# Patient Record
Sex: Female | Born: 1950
Health system: Southern US, Community
[De-identification: ages and names within clinical notes are randomized; demographics above are authoritative.]

## PROBLEM LIST (undated history)

## (undated) DIAGNOSIS — G43909 Migraine, unspecified, not intractable, without status migrainosus: Secondary | ICD-10-CM

## (undated) DIAGNOSIS — M1990 Unspecified osteoarthritis, unspecified site: Secondary | ICD-10-CM

## (undated) DIAGNOSIS — K589 Irritable bowel syndrome without diarrhea: Secondary | ICD-10-CM

## (undated) DIAGNOSIS — R112 Nausea with vomiting, unspecified: Secondary | ICD-10-CM

## (undated) DIAGNOSIS — F419 Anxiety disorder, unspecified: Secondary | ICD-10-CM

## (undated) DIAGNOSIS — R634 Abnormal weight loss: Secondary | ICD-10-CM

## (undated) DIAGNOSIS — S0531XA Ocular laceration without prolapse or loss of intraocular tissue, right eye, initial encounter: Secondary | ICD-10-CM

## (undated) DIAGNOSIS — J45909 Unspecified asthma, uncomplicated: Secondary | ICD-10-CM

## (undated) DIAGNOSIS — K219 Gastro-esophageal reflux disease without esophagitis: Secondary | ICD-10-CM

## (undated) DIAGNOSIS — R51 Headache: Secondary | ICD-10-CM

## (undated) HISTORY — DX: Irritable bowel syndrome, unspecified: K58.9

## (undated) HISTORY — PX: APPENDECTOMY: SHX54

## (undated) HISTORY — DX: Ocular laceration without prolapse or loss of intraocular tissue, right eye, initial encounter: S05.31XA

## (undated) HISTORY — DX: Migraine, unspecified, not intractable, without status migrainosus: G43.909

---

## 1981-11-16 HISTORY — PX: OVARIAN CYST REMOVAL: SHX89

## 1998-07-09 ENCOUNTER — Other Ambulatory Visit: Admission: RE | Admit: 1998-07-09 | Discharge: 1998-07-09 | Payer: Self-pay | Admitting: Obstetrics and Gynecology

## 1998-07-11 ENCOUNTER — Ambulatory Visit (HOSPITAL_COMMUNITY): Admission: RE | Admit: 1998-07-11 | Discharge: 1998-07-11 | Payer: Self-pay | Admitting: Obstetrics and Gynecology

## 1998-07-23 ENCOUNTER — Inpatient Hospital Stay (HOSPITAL_COMMUNITY): Admission: RE | Admit: 1998-07-23 | Discharge: 1998-07-25 | Payer: Self-pay | Admitting: Obstetrics and Gynecology

## 1998-11-16 HISTORY — PX: ABDOMINAL HYSTERECTOMY: SHX81

## 1999-04-28 ENCOUNTER — Emergency Department (HOSPITAL_COMMUNITY): Admission: EM | Admit: 1999-04-28 | Discharge: 1999-04-28 | Payer: Self-pay | Admitting: Emergency Medicine

## 1999-04-28 ENCOUNTER — Encounter: Payer: Self-pay | Admitting: Emergency Medicine

## 1999-10-15 ENCOUNTER — Other Ambulatory Visit: Admission: RE | Admit: 1999-10-15 | Discharge: 1999-10-15 | Payer: Self-pay | Admitting: Obstetrics and Gynecology

## 2000-04-19 ENCOUNTER — Encounter (INDEPENDENT_AMBULATORY_CARE_PROVIDER_SITE_OTHER): Payer: Self-pay | Admitting: *Deleted

## 2000-04-19 ENCOUNTER — Ambulatory Visit (HOSPITAL_COMMUNITY): Admission: RE | Admit: 2000-04-19 | Discharge: 2000-04-19 | Payer: Self-pay | Admitting: Gastroenterology

## 2000-07-08 ENCOUNTER — Emergency Department (HOSPITAL_COMMUNITY): Admission: EM | Admit: 2000-07-08 | Discharge: 2000-07-08 | Payer: Self-pay | Admitting: Emergency Medicine

## 2000-07-08 ENCOUNTER — Encounter: Payer: Self-pay | Admitting: Emergency Medicine

## 2000-10-29 ENCOUNTER — Other Ambulatory Visit: Admission: RE | Admit: 2000-10-29 | Discharge: 2000-10-29 | Payer: Self-pay | Admitting: Obstetrics and Gynecology

## 2001-12-06 ENCOUNTER — Other Ambulatory Visit: Admission: RE | Admit: 2001-12-06 | Discharge: 2001-12-06 | Payer: Self-pay | Admitting: Obstetrics and Gynecology

## 2003-11-30 ENCOUNTER — Ambulatory Visit (HOSPITAL_COMMUNITY): Admission: RE | Admit: 2003-11-30 | Discharge: 2003-11-30 | Payer: Self-pay | Admitting: Gastroenterology

## 2004-09-06 ENCOUNTER — Emergency Department (HOSPITAL_COMMUNITY): Admission: EM | Admit: 2004-09-06 | Discharge: 2004-09-06 | Payer: Self-pay | Admitting: Emergency Medicine

## 2005-03-04 ENCOUNTER — Emergency Department (HOSPITAL_COMMUNITY): Admission: EM | Admit: 2005-03-04 | Discharge: 2005-03-04 | Payer: Self-pay | Admitting: Emergency Medicine

## 2006-08-30 ENCOUNTER — Encounter: Admission: RE | Admit: 2006-08-30 | Discharge: 2006-08-30 | Payer: Self-pay | Admitting: Gastroenterology

## 2006-09-02 ENCOUNTER — Ambulatory Visit (HOSPITAL_COMMUNITY): Admission: RE | Admit: 2006-09-02 | Discharge: 2006-09-02 | Payer: Self-pay | Admitting: Gastroenterology

## 2006-12-09 ENCOUNTER — Encounter: Admission: RE | Admit: 2006-12-09 | Discharge: 2006-12-09 | Payer: Self-pay | Admitting: Family Medicine

## 2006-12-23 ENCOUNTER — Encounter: Admission: RE | Admit: 2006-12-23 | Discharge: 2006-12-23 | Payer: Self-pay | Admitting: Family Medicine

## 2007-02-04 ENCOUNTER — Encounter: Admission: RE | Admit: 2007-02-04 | Discharge: 2007-02-04 | Payer: Self-pay | Admitting: Family Medicine

## 2010-07-12 ENCOUNTER — Emergency Department (HOSPITAL_COMMUNITY): Admission: EM | Admit: 2010-07-12 | Discharge: 2010-07-12 | Payer: Self-pay | Admitting: Emergency Medicine

## 2010-07-14 ENCOUNTER — Ambulatory Visit (HOSPITAL_COMMUNITY)
Admission: RE | Admit: 2010-07-14 | Discharge: 2010-07-14 | Payer: Self-pay | Source: Home / Self Care | Admitting: Emergency Medicine

## 2010-11-28 ENCOUNTER — Ambulatory Visit (HOSPITAL_BASED_OUTPATIENT_CLINIC_OR_DEPARTMENT_OTHER)
Admission: RE | Admit: 2010-11-28 | Discharge: 2010-11-28 | Payer: Self-pay | Source: Home / Self Care | Attending: Family Medicine | Admitting: Family Medicine

## 2010-12-07 ENCOUNTER — Encounter: Payer: Self-pay | Admitting: Family Medicine

## 2010-12-17 ENCOUNTER — Encounter: Payer: Self-pay | Admitting: Family Medicine

## 2011-04-03 NOTE — Procedures (Signed)
[place]. [facility]  Patient:    Emily White, Emily White                     MRN: [id number] Proc. Date: 04/19/00 Attending:  Florencia Reasons, M.D. CC:         Harl Bowie, M.D.                           Procedure Report  PROCEDURE PERFORMED:  Upper endoscopy with biopsies.  ENDOSCOPIST:  Florencia Reasons, M.D.  INDICATIONS FOR PROCEDURE:  The patient is a 60 year old female with a 10-pound involuntary weight loss over the past year or so, as well as nonspecific abdominal discomfort and generalized fatigue.  FINDINGS:  Essentially normal exam.  DESCRIPTION OF PROCEDURE:  The nature, purpose and risks of the procedure had been discussed with the patient, who provided written consent.  Sedation was fentanyl 100 mcg and Versed 8 mg IV without arrhythmias or desaturation. The Olympus adult video endoscope was passed under direct vision.  The vocal cords were very briefly seen and appeared grossly normal.  The esophagus was entered without undue difficulty and had normal mucosa without evidence of Barretts esophagitis, varices, infection or neoplasia.  There was a small hiatal hernia present but this was best seen on retroflex viewing which showed an otherwise normal cardia of the stomach.  Upon entering the stomach with the endoscope, there was no significant residual, just a little bit of clear fluid, no blood, coffee ground material or bile.  The gastric mucosa was normal, without evidence of gastritis, erosions, ulcers, polyps or masses. The pylorus and duodenal bulb were unremarkable.  The second duodenum may have had some slight villous flattening but this is a very soft call; multiple biopsies were obtained prior to removal of the scope.  The patient tolerated the procedure well and there were no apparent complications.  IMPRESSION:  Essentially normal upper endoscopy, without source of weight loss endoscopically evident.  PLAN:  Await pathology  on duodenal biopsies.  Continue Protonix 40 mg daily since this does seem to have offered her some improvement. DD:  04/19/00 TD:  04/22/00 Job: [id number]

## 2011-04-03 NOTE — Op Note (Signed)
Emily White, Emily White                         ACCOUNT NO.:  [id number]   MEDICAL RECORD NO.:  [id number]                   PATIENT TYPE:  AMB   LOCATION:  ENDO                                 FACILITY:  [facility]   PHYSICIAN:  Bernette Redbird, M.D.                DATE OF BIRTH:  14-May-1951   DATE OF PROCEDURE:  11/30/2003  DATE OF DISCHARGE:                                 OPERATIVE REPORT   PROCEDURE:  Colonoscopy.   INDICATIONS FOR PROCEDURE:  Family history of colon cancer.   ENDOSCOPIST:  Bernette Redbird, M.D.   FINDINGS:  Fixation of the sigmoid colon probably due to pelvic adhesions.   DESCRIPTION OF PROCEDURE:  The nature, purpose and risks of the procedure  had been discussed with the patient who provided written consent.  Sedation  was Phenergan 12.5 mg IV (because of a history of nausea), Fentanyl 100 mcg  IV and Versed 10 mg IV prior to and during the course of the procedure.   The Olympus adjustable tension pediatric video colonoscope was advanced  through the cecum as identified by clear visualization of the ileocecal  valve and transillumination to the right lower quadrant and pullback was  then performed.  The quality of the prep was excellent and it was felt that  all areas were well seen.   This was a technically challenging exam due to severe angulation and  fixation in the sigmoid region.  It probably took about five or ten minutes  just to negotiate my way through that very limited section of the colon.  This was accomplished with the patient in the supine position and also with  the help of some external abdominal pressure.  Once past that area, however,  the scope was quite easily to advance the remainder of the way around the  colon, especially after taking out a loop.   Apart from the above mentioned angulation and fixation, this was a normal  examination.  No polyps, cancer, colitis, vascular malformations or  diverticulosis were noted.  Due to a small  rectal ampulla, I did not attempt  retroflexion in the rectum, but careful antegrade viewing as well as  reinspection of the rectum disclosed no abnormalities.   No biopsies were obtained.  The patient tolerated the procedure well and  there were no apparent complications.   IMPRESSION:  1. Family history of colon cancer (V16.0).  2. History of intermittent abdominal pain including the right lower     quadrant.  No source identified on today's exam (789.03).  3. No polyps or pre-neoplastic lesions identified today.   PLAN:  1. Repeat evaluation of the colon in five years.  Due to the technical     difficulties of examining this patient's colon, consideration could be     given to a virtual colonoscopy at that time.  Bernette Redbird, M.D.    RB/MEDQ  D:  11/30/2003  T:  11/30/2003  Job:  [id number]   cc:   Donia Guiles, M.D.  [address] [place]  [place] [address]  Fax: [phone]

## 2011-07-29 ENCOUNTER — Inpatient Hospital Stay (INDEPENDENT_AMBULATORY_CARE_PROVIDER_SITE_OTHER)
Admission: RE | Admit: 2011-07-29 | Discharge: 2011-07-29 | Disposition: A | Discharge status: Home / Self Care | Payer: PRIVATE HEALTH INSURANCE | Source: Ambulatory Visit | Attending: Family Medicine | Admitting: Family Medicine

## 2011-07-29 ENCOUNTER — Ambulatory Visit (INDEPENDENT_AMBULATORY_CARE_PROVIDER_SITE_OTHER): Payer: PRIVATE HEALTH INSURANCE

## 2011-07-29 ENCOUNTER — Other Ambulatory Visit (HOSPITAL_COMMUNITY): Payer: Self-pay | Admitting: Family Medicine

## 2011-07-29 DIAGNOSIS — W19XXXA Unspecified fall, initial encounter: Secondary | ICD-10-CM

## 2011-07-29 DIAGNOSIS — S93409A Sprain of unspecified ligament of unspecified ankle, initial encounter: Secondary | ICD-10-CM

## 2011-08-31 ENCOUNTER — Other Ambulatory Visit (HOSPITAL_BASED_OUTPATIENT_CLINIC_OR_DEPARTMENT_OTHER): Payer: Self-pay | Admitting: Orthopedic Surgery

## 2011-08-31 DIAGNOSIS — R52 Pain, unspecified: Secondary | ICD-10-CM

## 2011-09-01 ENCOUNTER — Ambulatory Visit (HOSPITAL_BASED_OUTPATIENT_CLINIC_OR_DEPARTMENT_OTHER)
Admission: RE | Admit: 2011-09-01 | Discharge: 2011-09-01 | Disposition: A | Discharge status: Home / Self Care | Payer: PRIVATE HEALTH INSURANCE | Source: Ambulatory Visit | Attending: Orthopedic Surgery | Admitting: Orthopedic Surgery

## 2011-09-01 DIAGNOSIS — R609 Edema, unspecified: Secondary | ICD-10-CM

## 2011-09-01 DIAGNOSIS — M25476 Effusion, unspecified foot: Secondary | ICD-10-CM | POA: Insufficient documentation

## 2011-09-01 DIAGNOSIS — R52 Pain, unspecified: Secondary | ICD-10-CM

## 2011-09-01 DIAGNOSIS — M25579 Pain in unspecified ankle and joints of unspecified foot: Secondary | ICD-10-CM | POA: Insufficient documentation

## 2011-09-01 DIAGNOSIS — M659 Synovitis and tenosynovitis, unspecified: Secondary | ICD-10-CM

## 2011-09-01 DIAGNOSIS — M67919 Unspecified disorder of synovium and tendon, unspecified shoulder: Secondary | ICD-10-CM | POA: Insufficient documentation

## 2011-09-01 DIAGNOSIS — M25473 Effusion, unspecified ankle: Secondary | ICD-10-CM | POA: Insufficient documentation

## 2011-09-01 DIAGNOSIS — M719 Bursopathy, unspecified: Secondary | ICD-10-CM | POA: Insufficient documentation

## 2012-08-16 ENCOUNTER — Other Ambulatory Visit: Payer: Self-pay | Admitting: Obstetrics and Gynecology

## 2012-08-16 DIAGNOSIS — N6311 Unspecified lump in the right breast, upper outer quadrant: Secondary | ICD-10-CM

## 2012-08-18 ENCOUNTER — Ambulatory Visit
Admission: RE | Admit: 2012-08-18 | Discharge: 2012-08-18 | Disposition: A | Discharge status: Home / Self Care | Payer: PRIVATE HEALTH INSURANCE | Source: Ambulatory Visit | Attending: Obstetrics and Gynecology | Admitting: Obstetrics and Gynecology

## 2012-08-18 DIAGNOSIS — N6311 Unspecified lump in the right breast, upper outer quadrant: Secondary | ICD-10-CM

## 2012-09-06 ENCOUNTER — Other Ambulatory Visit: Payer: Self-pay | Admitting: Obstetrics and Gynecology

## 2013-07-18 ENCOUNTER — Other Ambulatory Visit: Payer: Self-pay

## 2013-07-18 DIAGNOSIS — Z1231 Encounter for screening mammogram for malignant neoplasm of breast: Secondary | ICD-10-CM

## 2013-08-21 ENCOUNTER — Ambulatory Visit
Admission: RE | Admit: 2013-08-21 | Discharge: 2013-08-21 | Disposition: A | Discharge status: Home / Self Care | Payer: PRIVATE HEALTH INSURANCE | Source: Ambulatory Visit

## 2013-08-21 DIAGNOSIS — Z1231 Encounter for screening mammogram for malignant neoplasm of breast: Secondary | ICD-10-CM

## 2014-01-04 ENCOUNTER — Other Ambulatory Visit: Payer: Self-pay | Admitting: Physician Assistant

## 2014-01-04 ENCOUNTER — Ambulatory Visit
Admission: RE | Admit: 2014-01-04 | Discharge: 2014-01-04 | Disposition: A | Discharge status: Home / Self Care | Payer: PRIVATE HEALTH INSURANCE | Source: Ambulatory Visit | Attending: Physician Assistant | Admitting: Physician Assistant

## 2014-01-04 DIAGNOSIS — R079 Chest pain, unspecified: Secondary | ICD-10-CM

## 2014-03-21 ENCOUNTER — Other Ambulatory Visit: Payer: Self-pay | Admitting: Physician Assistant

## 2014-03-21 ENCOUNTER — Ambulatory Visit
Admission: RE | Admit: 2014-03-21 | Discharge: 2014-03-21 | Disposition: A | Discharge status: Home / Self Care | Payer: PRIVATE HEALTH INSURANCE | Source: Ambulatory Visit | Attending: Physician Assistant | Admitting: Physician Assistant

## 2014-03-21 ENCOUNTER — Ambulatory Visit: Payer: PRIVATE HEALTH INSURANCE

## 2014-03-21 DIAGNOSIS — M799 Soft tissue disorder, unspecified: Secondary | ICD-10-CM

## 2014-03-22 ENCOUNTER — Other Ambulatory Visit: Payer: Self-pay | Admitting: Physician Assistant

## 2014-03-22 DIAGNOSIS — R6889 Other general symptoms and signs: Secondary | ICD-10-CM

## 2014-03-23 ENCOUNTER — Other Ambulatory Visit: Payer: PRIVATE HEALTH INSURANCE

## 2014-03-26 ENCOUNTER — Ambulatory Visit
Admission: RE | Admit: 2014-03-26 | Discharge: 2014-03-26 | Disposition: A | Discharge status: Home / Self Care | Payer: PRIVATE HEALTH INSURANCE | Source: Ambulatory Visit | Attending: Physician Assistant | Admitting: Physician Assistant

## 2014-03-26 DIAGNOSIS — R6889 Other general symptoms and signs: Secondary | ICD-10-CM

## 2014-06-06 ENCOUNTER — Encounter (HOSPITAL_COMMUNITY): Payer: Self-pay | Admitting: Pharmacy Technician

## 2014-06-20 ENCOUNTER — Encounter (HOSPITAL_COMMUNITY): Payer: Self-pay | Admitting: *Deleted

## 2014-06-28 ENCOUNTER — Encounter (HOSPITAL_COMMUNITY): Payer: PRIVATE HEALTH INSURANCE | Admitting: Anesthesiology

## 2014-06-28 ENCOUNTER — Ambulatory Visit (HOSPITAL_COMMUNITY): Payer: PRIVATE HEALTH INSURANCE | Admitting: Anesthesiology

## 2014-06-28 ENCOUNTER — Encounter (HOSPITAL_COMMUNITY)
Admission: RE | Disposition: A | Discharge status: Home / Self Care | Payer: Self-pay | Source: Ambulatory Visit | Attending: Gastroenterology

## 2014-06-28 ENCOUNTER — Ambulatory Visit (HOSPITAL_COMMUNITY)
Admission: RE | Admit: 2014-06-28 | Discharge: 2014-06-28 | Disposition: A | Discharge status: Home / Self Care | Payer: PRIVATE HEALTH INSURANCE | Source: Ambulatory Visit | Attending: Gastroenterology | Admitting: Gastroenterology

## 2014-06-28 ENCOUNTER — Encounter (HOSPITAL_COMMUNITY): Payer: Self-pay | Admitting: Gastroenterology

## 2014-06-28 DIAGNOSIS — K589 Irritable bowel syndrome without diarrhea: Secondary | ICD-10-CM | POA: Diagnosis present

## 2014-06-28 DIAGNOSIS — K219 Gastro-esophageal reflux disease without esophagitis: Secondary | ICD-10-CM | POA: Insufficient documentation

## 2014-06-28 DIAGNOSIS — K5730 Diverticulosis of large intestine without perforation or abscess without bleeding: Secondary | ICD-10-CM | POA: Insufficient documentation

## 2014-06-28 DIAGNOSIS — Z90710 Acquired absence of both cervix and uterus: Secondary | ICD-10-CM | POA: Diagnosis not present

## 2014-06-28 DIAGNOSIS — Z9089 Acquired absence of other organs: Secondary | ICD-10-CM | POA: Insufficient documentation

## 2014-06-28 DIAGNOSIS — Z800 Family history of malignant neoplasm of digestive organs: Secondary | ICD-10-CM | POA: Insufficient documentation

## 2014-06-28 DIAGNOSIS — Z881 Allergy status to other antibiotic agents status: Secondary | ICD-10-CM | POA: Insufficient documentation

## 2014-06-28 DIAGNOSIS — F411 Generalized anxiety disorder: Secondary | ICD-10-CM | POA: Insufficient documentation

## 2014-06-28 DIAGNOSIS — J45909 Unspecified asthma, uncomplicated: Secondary | ICD-10-CM | POA: Diagnosis not present

## 2014-06-28 DIAGNOSIS — Z79899 Other long term (current) drug therapy: Secondary | ICD-10-CM | POA: Insufficient documentation

## 2014-06-28 DIAGNOSIS — Z7982 Long term (current) use of aspirin: Secondary | ICD-10-CM | POA: Insufficient documentation

## 2014-06-28 DIAGNOSIS — G43909 Migraine, unspecified, not intractable, without status migrainosus: Secondary | ICD-10-CM | POA: Insufficient documentation

## 2014-06-28 DIAGNOSIS — D126 Benign neoplasm of colon, unspecified: Secondary | ICD-10-CM | POA: Insufficient documentation

## 2014-06-28 DIAGNOSIS — M129 Arthropathy, unspecified: Secondary | ICD-10-CM | POA: Diagnosis not present

## 2014-06-28 HISTORY — DX: Unspecified osteoarthritis, unspecified site: M19.90

## 2014-06-28 HISTORY — DX: Anxiety disorder, unspecified: F41.9

## 2014-06-28 HISTORY — DX: Unspecified asthma, uncomplicated: J45.909

## 2014-06-28 HISTORY — DX: Gastro-esophageal reflux disease without esophagitis: K21.9

## 2014-06-28 HISTORY — DX: Nausea with vomiting, unspecified: R11.2

## 2014-06-28 HISTORY — DX: Headache: R51

## 2014-06-28 HISTORY — PX: COLONOSCOPY WITH PROPOFOL: SHX5780

## 2014-06-28 SURGERY — COLONOSCOPY WITH PROPOFOL
Anesthesia: Monitor Anesthesia Care

## 2014-06-28 MED ORDER — LACTATED RINGERS IV SOLN
INTRAVENOUS | Status: DC
  Administered 2014-06-28: 1000 mL via INTRAVENOUS

## 2014-06-28 MED ORDER — PROPOFOL 10 MG/ML IV BOLUS
INTRAVENOUS | Status: AC
  Filled 2014-06-28: qty 20

## 2014-06-28 MED ORDER — PROPOFOL INFUSION 10 MG/ML OPTIME
INTRAVENOUS | Status: DC | PRN
  Administered 2014-06-28: 120 ug/kg/min via INTRAVENOUS

## 2014-06-28 MED ORDER — LIDOCAINE HCL (CARDIAC) 20 MG/ML IV SOLN
INTRAVENOUS | Status: DC | PRN
  Administered 2014-06-28: 50 mg via INTRAVENOUS

## 2014-06-28 MED ORDER — SODIUM CHLORIDE 0.9 % IV SOLN
INTRAVENOUS | Status: DC
Start: 2014-06-28 — End: 2014-06-28

## 2014-06-28 MED ORDER — PROPOFOL 10 MG/ML IV BOLUS
INTRAVENOUS | Status: DC | PRN
  Administered 2014-06-28: 20 mg via INTRAVENOUS
  Administered 2014-06-28: 50 mg via INTRAVENOUS
  Administered 2014-06-28: 20 mg via INTRAVENOUS

## 2014-06-28 MED ORDER — LIDOCAINE HCL (CARDIAC) 20 MG/ML IV SOLN
INTRAVENOUS | Status: AC
  Filled 2014-06-28: qty 5

## 2014-06-28 SURGICAL SUPPLY — 21 items

## 2014-06-28 NOTE — Transfer of Care (Signed)
Immediate Anesthesia Transfer of Care Note  Patient: Emily White  Procedure(s) Performed: Procedure(s): COLONOSCOPY WITH PROPOFOL (N/A)  Patient Location: PACU  Anesthesia Type:MAC  Level of Consciousness: awake, alert  and oriented  Airway & Oxygen Therapy: Patient Spontanous Breathing and Patient connected to face mask oxygen  Post-op Assessment: Report given to PACU RN and Post -op Vital signs reviewed and stable  Post vital signs: Reviewed and stable  Complications: No apparent anesthesia complications

## 2014-06-28 NOTE — H&P (Signed)
Emily White is an 63 y.o. female.   Chief Complaint: Family history of colon cancer HPI: This is a pleasant 63 year old female with a history of IBS, who has a family history of colon cancer in her father at age 80, and at least 2 previous negative colonoscopies. She is known to have quite a bit of sigmoid fixation. She has been relatively symptomatic with her IBS over the past year or 2 since her husband was diagnosed with lung cancer.  Past Medical History  Diagnosis Date  . Asthma   . Anxiety   . GERD (gastroesophageal reflux disease)     asymptomatic  . Headache(784.0)     migraine  . Arthritis     Past Surgical History  Procedure Laterality Date  . Right ovarine cyst removed    . Abdominal hysterectomy      complete  . Appendectomy      History reviewed. No pertinent family history. Social History:  reports that she quit smoking about 45 years ago. Her smoking use included Cigarettes. She has a 10 pack-year smoking history. She has never used smokeless tobacco. She reports that she drinks alcohol. She reports that she does not use illicit drugs.  Allergies:  Allergies  Allergen Reactions  . Keflex [Cephalexin] Anaphylaxis and Nausea And Vomiting    Patient states abdominal pain also  . Macrobid [Nitrofurantoin Monohyd Macro] Itching    Abdominal pain    Medications Prior to Admission  Medication Sig Dispense Refill  . albuterol (PROVENTIL HFA;VENTOLIN HFA) 108 (90 BASE) MCG/ACT inhaler Inhale 1 puff into the lungs every 6 (six) hours as needed for wheezing or shortness of breath.      . ALPRAZolam (XANAX) 0.25 MG tablet Take 0.25 mg by mouth 3 (three) times daily as needed for anxiety.      Marland Kitchen aspirin EC 81 MG tablet Take 81 mg by mouth daily.      Marland Kitchen BIOTIN PO Take 1 capsule by mouth daily.      . Calcium-Magnesium (CAL-MAG PO) Take 1 tablet by mouth daily.      . Cholecalciferol (VITAMIN D) 2000 UNITS tablet Take 2,000 Units by mouth daily.      . Digestive Enzymes  (DIGESTIVE ENZYME PO) Take 86 mg by mouth daily.      Marland Kitchen estradiol (ESTRACE) 0.5 MG tablet Take 0.5 mg by mouth daily.      . fluticasone (FLOVENT HFA) 44 MCG/ACT inhaler Inhale 2 puffs into the lungs every morning.      . frovatriptan (FROVA) 2.5 MG tablet Take 2.5 mg by mouth daily as needed for migraine. If recurs, may repeat after 2 hours. Max of 3 tabs in 24 hours.      . Melatonin 1 MG TABS Take 1 tablet by mouth at bedtime as needed (for sleep).      Marland Kitchen OVER THE COUNTER MEDICATION Take 1 tablet by mouth daily. Astragalus-for immune function      . OVER THE COUNTER MEDICATION Take 1 tablet by mouth daily. Black current seed      . vitamin B-12 (CYANOCOBALAMIN) 100 MCG tablet Take 100 mcg by mouth daily.        No results found for this or any previous visit (from the past 48 hour(s)). No results found.  ROS increased IBS symptoms  There were no vitals taken for this visit. Physical Exam healthy in appearance. Anicteric no pallor. Chest clear, heart without murmur or arrhythmia. Oropharynx benign, abdomen soft and nontender, without  masses  Assessment/Plan The patient is medically fit for screening colonoscopy. Based on past experience, I will plan to use the ultraslim colonoscope this time to make it easier to negotiate the sigmoid region.  Mikhai Bienvenue V 06/28/2014, 8:38 AM

## 2014-06-28 NOTE — Op Note (Signed)
[facility] [place], [address]   COLONOSCOPY PROCEDURE REPORT  PATIENT: Emily, White  MR#: [id number] BIRTHDATE: 1951-06-13 , 63  yrs. old GENDER: Female ENDOSCOPIST: Ronald Lobo, MD REFERRED BY:   Mayra Neer, MD PROCEDURE DATE:  06/28/2014 PROCEDURE:     colonoscopy with biopsy ASA CLASS: INDICATIONS:  family history of colon cancer in her father at age 61. Also, history of IBS MEDICATIONS:    MAC, per anesthesia  DESCRIPTION OF PROCEDURE: the patient came as an outpatient to the [place] endoscopy unit and provided written consent. Time out was performed and the patient received propofol sedation without any clinical instability throughout the procedure.  Based on prior experience with a fixated sigmoid region, I elected to use the Pentax ultraslim colonoscope for this procedure. It negotiated the sigmoid region easily and the scope was advanced thereafter with some looping that was controlled by external abdominal compression. The cecum was reached and the terminal ileum was entered for a short distance and appeared normal. Pullback was then performed. The appendiceal orifice was identified. The quality of prep was excellent and it is felt that all areas were well seen.  In the descending colon was a tiny bump, roughly 2 mm, possibly an early polyp, which was cold biopsied.  In the sigmoid region, I saw a solitary small diverticulum.  This was otherwise a completely normal exam. No large polyps, cancer, colitis, or vascular ectasia were noted.  Retroflexion in the rectum and reinspection and rectum were unremarkable.  The patient tolerated the procedure well.     COMPLICATIONS: None  ENDOSCOPIC IMPRESSION:  1. Possible tiny polyp in the descending colon, biopsied 2. Minimal diverticular change  RECOMMENDATIONS:  Await pathology on polyp. Anticipate followup colonoscopy for screening and/or surveillance in 5-10  years    _______________________________ eSigned:  Ronald Lobo, MD 06/28/2014 10:14 AM

## 2014-06-28 NOTE — Anesthesia Preprocedure Evaluation (Signed)
Anesthesia Evaluation  Patient identified by MRN, date of birth, ID band Patient awake    Reviewed: Allergy & Precautions, H&P , NPO status , Patient's Chart, lab work & pertinent test results  Airway Mallampati: II TM Distance: >3 FB Neck ROM: Full    Dental no notable dental hx.    Pulmonary asthma , former smoker,  breath sounds clear to auscultation  Pulmonary exam normal       Cardiovascular negative cardio ROS  Rhythm:Regular Rate:Normal     Neuro/Psych  Headaches, PSYCHIATRIC DISORDERS Anxiety    GI/Hepatic Neg liver ROS, GERD-  ,  Endo/Other  negative endocrine ROS  Renal/GU negative Renal ROS     Musculoskeletal negative musculoskeletal ROS (+)   Abdominal   Peds  Hematology negative hematology ROS (+)   Anesthesia Other Findings   Reproductive/Obstetrics negative OB ROS                           Anesthesia Physical Anesthesia Plan  ASA: II  Anesthesia Plan: MAC   Post-op Pain Management:    Induction: Intravenous  Airway Management Planned:   Additional Equipment:   Intra-op Plan:   Post-operative Plan:   Informed Consent: I have reviewed the patients History and Physical, chart, labs and discussed the procedure including the risks, benefits and alternatives for the proposed anesthesia with the patient or authorized representative who has indicated his/her understanding and acceptance.   Dental advisory given  Plan Discussed with: CRNA  Anesthesia Plan Comments:         Anesthesia Quick Evaluation

## 2014-06-28 NOTE — Anesthesia Postprocedure Evaluation (Signed)
Anesthesia Post Note  Patient: Emily White  Procedure(s) Performed: Procedure(s) (LRB): COLONOSCOPY WITH PROPOFOL (N/A)  Anesthesia type: MAC  Patient location: PACU  Post pain: Pain level controlled  Post assessment: Post-op Vital signs reviewed  Last Vitals: BP 133/71  Pulse 51  Temp(Src) 36.6 C (Oral)  Resp 12  Ht 5\' 3"  (1.6 m)  Wt 113 lb (51.256 kg)  BMI 20.02 kg/m2  SpO2 100%  Post vital signs: Reviewed  Level of consciousness: awake  Complications: No apparent anesthesia complications

## 2014-06-28 NOTE — Discharge Instructions (Signed)
Colonoscopy, Care After °These instructions give you information on caring for yourself after your procedure. Your doctor may also give you more specific instructions. Call your doctor if you have any problems or questions after your procedure. °HOME CARE °· Do not drive for 24 hours. °· Do not sign important papers or use machinery for 24 hours. °· You may shower. °· You may go back to your usual activities, but go slower for the first 24 hours. °· Take rest breaks often during the first 24 hours. °· Walk around or use warm packs on your belly (abdomen) if you have belly cramping or gas. °· Drink enough fluids to keep your pee (urine) clear or pale yellow. °· Resume your normal diet. Avoid heavy or fried foods. °· Avoid drinking alcohol for 24 hours or as told by your doctor. °· Only take medicines as told by your doctor. °If a tissue sample (biopsy) was taken during the procedure:  °· Do not take aspirin or blood thinners for 7 days, or as told by your doctor. °· Do not drink alcohol for 7 days, or as told by your doctor. °· Eat soft foods for the first 24 hours. °GET HELP IF: °You still have a small amount of blood in your poop (stool) 2-3 days after the procedure. °GET HELP RIGHT AWAY IF: °· You have more than a small amount of blood in your poop. °· You see clumps of tissue (blood clots) in your poop. °· Your belly is puffy (swollen). °· You feel sick to your stomach (nauseous) or throw up (vomit). °· You have a fever. °· You have belly pain that gets worse and medicine does not help. °MAKE SURE YOU: °· Understand these instructions. °· Will watch your condition. °· Will get help right away if you are not doing well or get worse. °Document Released: 12/05/2010 Document Revised: 11/07/2013 Document Reviewed: 07/10/2013 °[employer]® Patient Information ©2015 [employer]. This information is not intended to replace advice given to you by your health care provider. Make sure you discuss any questions you have with  your health care provider. ° °

## 2014-06-29 ENCOUNTER — Encounter (HOSPITAL_COMMUNITY): Payer: Self-pay | Admitting: Gastroenterology

## 2014-08-21 ENCOUNTER — Other Ambulatory Visit: Payer: Self-pay

## 2014-08-21 DIAGNOSIS — Z1239 Encounter for other screening for malignant neoplasm of breast: Secondary | ICD-10-CM

## 2014-09-05 ENCOUNTER — Other Ambulatory Visit: Payer: Self-pay

## 2014-09-05 DIAGNOSIS — Z1231 Encounter for screening mammogram for malignant neoplasm of breast: Secondary | ICD-10-CM

## 2014-09-07 ENCOUNTER — Ambulatory Visit
Admission: RE | Admit: 2014-09-07 | Discharge: 2014-09-07 | Disposition: A | Discharge status: Home / Self Care | Payer: PRIVATE HEALTH INSURANCE | Source: Ambulatory Visit

## 2014-09-07 DIAGNOSIS — Z1231 Encounter for screening mammogram for malignant neoplasm of breast: Secondary | ICD-10-CM

## 2015-07-05 ENCOUNTER — Other Ambulatory Visit: Payer: Self-pay | Admitting: Otolaryngology

## 2015-07-05 DIAGNOSIS — R42 Dizziness and giddiness: Secondary | ICD-10-CM

## 2015-07-05 DIAGNOSIS — H8190 Unspecified disorder of vestibular function, unspecified ear: Secondary | ICD-10-CM

## 2015-07-17 ENCOUNTER — Other Ambulatory Visit: Payer: PRIVATE HEALTH INSURANCE

## 2015-08-05 ENCOUNTER — Ambulatory Visit
Admission: RE | Admit: 2015-08-05 | Discharge: 2015-08-05 | Disposition: A | Discharge status: Home / Self Care | Payer: PRIVATE HEALTH INSURANCE | Source: Ambulatory Visit | Attending: Otolaryngology | Admitting: Otolaryngology

## 2015-08-05 DIAGNOSIS — R42 Dizziness and giddiness: Secondary | ICD-10-CM

## 2015-08-05 DIAGNOSIS — H8190 Unspecified disorder of vestibular function, unspecified ear: Secondary | ICD-10-CM

## 2015-08-05 MED ORDER — GADOBENATE DIMEGLUMINE 529 MG/ML IV SOLN
10.0000 mL | Freq: Once | INTRAVENOUS | Status: AC | PRN
  Administered 2015-08-05: 10 mL via INTRAVENOUS

## 2015-08-06 ENCOUNTER — Other Ambulatory Visit: Payer: Self-pay

## 2015-08-06 DIAGNOSIS — Z1231 Encounter for screening mammogram for malignant neoplasm of breast: Secondary | ICD-10-CM

## 2015-08-26 ENCOUNTER — Ambulatory Visit (INDEPENDENT_AMBULATORY_CARE_PROVIDER_SITE_OTHER): Payer: PRIVATE HEALTH INSURANCE | Admitting: Neurology

## 2015-08-26 ENCOUNTER — Encounter: Payer: Self-pay | Admitting: Neurology

## 2015-08-26 VITALS — BP 112/69 | HR 86 | Ht 63.0 in | Wt 110.2 lb

## 2015-08-26 DIAGNOSIS — G43109 Migraine with aura, not intractable, without status migrainosus: Secondary | ICD-10-CM | POA: Insufficient documentation

## 2015-08-26 DIAGNOSIS — G43809 Other migraine, not intractable, without status migrainosus: Secondary | ICD-10-CM

## 2015-08-26 MED ORDER — VENLAFAXINE HCL ER 37.5 MG PO CP24: 37.5000 mg | ORAL_CAPSULE | Freq: Every day | ORAL | Status: DC

## 2015-08-26 NOTE — Progress Notes (Signed)
Faxed rx venlafaxine per pt request to: Pharmacy added to pt chart Outpatient Pharmacy Location: Main Floor [facility]  Hours of operation:  Monday - Friday 7 am - 9 pm Saturday & Sunday 9 am - 5 pm Phone: [phone]  Received fax confirmation.

## 2015-08-26 NOTE — Patient Instructions (Addendum)
  Overall you are doing fairly well but I do want to suggest a few things today:   Remember to drink plenty of fluid, eat healthy meals and do not skip any meals. Try to eat protein with a every meal and eat a healthy snack such as fruit or nuts in between meals. Try to keep a regular sleep-wake schedule and try to exercise daily, particularly in the form of walking, 20-30 minutes a day, if you can.   As far as your medications are concerned, I would like to suggest: Riboflavin 400 mg/day (Vitamin B2) (every day) Magnesium 400-600 mg (trigmagnesium dicitrate) daily (every day) Venlafaxine XR 37.5mg  daily  I would like to see you back in 3-6 months as needed, sooner if we need to. Please call us with any interim questions, concerns, problems, updates or refill requests.   Our phone number is [phone]. We also have an after hours call service for urgent matters and there is a physician on-call for urgent questions. For any emergencies you know to call 911 or go to the nearest emergency room

## 2015-08-26 NOTE — Progress Notes (Signed)
[facility]    Provider:  Dr Jaynee Eagles Referring Provider: Vicie Mutters, MD Primary Care Physician:  Mayra Neer, MD  CC:  Vestibular migraine  HPI:  Emily White is a 64 y.o. female here as a referral from Dr. Thornell Mule for vestibular migraines. PMHx migraine, anxiety. She has a lot of stress in her life. Her husband had lung cancer and has a daughter who is an alcoholic. She takes frova and doesn't like the way she feels. She saw Dr. Thornell Mule for dizziness. For 6-12 months she has transient dizziness, when she looks down feels like she is going to fall, feelings of nausea like she is sea sick and has to stare at something and not move. She was having ear pain and crackling in the ears, tinnitus and she went to see Dr. Thornell Mule whose evaluation was vestibular migraines. She has migraines, she had one this past weekend in the middle of the night. She will get fatigue a day or two before, muscle aches in the neck, speech doesn;t come out right, she will drop things, she will have loose bowel movements. She will have extreme acuity of vision and then the view focuses in then zig zag lines in the vision for 30 minutes and then sometimes it is followed by a headache. Headache pain will start on the left and extend on the left side of the face, tingling around the eyes and mouth, may switch sides. It can a day or a week. She has 14 headaches days a week, 4 migraines a month. She has nausea, no vomiting. Had a side effect to topamax. Aunts with PD and Alzheimers. Older brother with migraine and mother with migraine. No other focal neurologic deficits.  Reviewed notes, labs and imaging from outside physicians, which showed:  MRi of the brain, personally reviewed images and agree with following: There is no evidence of acute infarct, intracranial hemorrhage, mass, midline shift, or extra-axial fluid collection. No significant white matter disease is seen for age. Ventricles and sulci are normal.  No abnormal brain parenchymal or meningeal enhancement is identified.  Orbits are unremarkable. There is mild bilateral ethmoid air cell and left maxillary sinus mucosal thickening. No significant mastoid effusion is seen. Major intracranial vascular flow voids are preserved.  Dedicated imaging through the internal auditory canals demonstrates a normal course of cranial nerves VII and VIII without evidence of mass or abnormal enhancement. Inner ear structures demonstrate normal signal bilaterally. No mass is seen within the cerebellopontine angles.  IMPRESSION: Unremarkable appearance of the brain and internal auditory canals.  Review of Systems: Patient complains of symptoms per HPI as well as the following symptoms: fatigue, eye pain, palpitations, wheezing, rinign in ears, spinning sensation, constipation, aching muscles, headache, dizziness, insomnia, anxiety, decreased energy. Pertinent negatives per HPI. All others negative.   Social History   Social History  . Marital Status: Married    Spouse Name: Richard   . Number of Children: 3  . Years of Education: 16   Occupational History  . Not on file.   Social History Main Topics  . Smoking status: Former Smoker -- 2.00 packs/day for 5 years    Types: Cigarettes    Quit date: 11/16/1968  . Smokeless tobacco: Never Used  . Alcohol Use: Yes     Comment: 1 glass wine per day  . Drug Use: No  . Sexual Activity: Not on file   Other Topics Concern  . Not on file   Social History Narrative  Lives at home with husband, Delfino Lovett.    Caffeine use: 1 cup coffee per day       Family History  Problem Relation Age of Onset  . Migraines Mother   . Migraines Brother     Past Medical History  Diagnosis Date  . Asthma   . Anxiety   . GERD (gastroesophageal reflux disease)     asymptomatic  . Headache(784.0)     migraine  . Arthritis   . Nausea and vomiting     After surgery  . IBS (irritable bowel syndrome)   .  Migraine     Past Surgical History  Procedure Laterality Date  . Right ovarine cyst removed  1983  . Abdominal hysterectomy  2000    complete  . Appendectomy    . Colonoscopy with propofol N/A 06/28/2014    Procedure: COLONOSCOPY WITH PROPOFOL;  Surgeon: Cleotis Nipper, MD;  Location: [facility];  Service: Endoscopy;  Laterality: N/A;    Current Outpatient Prescriptions  Medication Sig Dispense Refill  . albuterol (PROVENTIL HFA;VENTOLIN HFA) 108 (90 BASE) MCG/ACT inhaler Inhale 1 puff into the lungs every 6 (six) hours as needed for wheezing or shortness of breath.    . ALPRAZolam (XANAX) 0.25 MG tablet Take 0.25 mg by mouth 3 (three) times daily as needed for anxiety.    Marland Kitchen aspirin EC 81 MG tablet Take 81 mg by mouth daily.    Marland Kitchen b complex vitamins tablet Take 1 tablet by mouth daily.    Marland Kitchen BIOTIN PO Take 1 capsule by mouth daily.    . Calcium-Magnesium (CAL-MAG PO) Take 1 tablet by mouth daily. 500-250mg     . Cholecalciferol (VITAMIN D) 2000 UNITS tablet Take 4,000 Units by mouth daily.     . Digestive Enzymes (DIGESTIVE ENZYME PO) Take 86 mg by mouth daily.    Marland Kitchen estradiol (ESTRACE) 0.5 MG tablet Take 0.5 mg by mouth daily.    . fluticasone (FLOVENT HFA) 44 MCG/ACT inhaler Inhale 2 puffs into the lungs every morning.    . frovatriptan (FROVA) 2.5 MG tablet Take 2.5 mg by mouth daily as needed for migraine. If recurs, may repeat after 2 hours. Max of 3 tabs in 24 hours.    . Melatonin 1 MG TABS Take 1 tablet by mouth at bedtime as needed (for sleep).    Marland Kitchen OVER THE COUNTER MEDICATION Take 1 tablet by mouth daily. Astragalus-for immune function    . OVER THE COUNTER MEDICATION Take 1 tablet by mouth daily. Black current seed    . riboflavin (VITAMIN B-2) 100 MG TABS tablet Take 100 mg by mouth daily.    . vitamin E 400 UNIT capsule Take 400 Units by mouth daily.    Marland Kitchen venlafaxine XR (EFFEXOR-XR) 37.5 MG 24 hr capsule Take 1 capsule (37.5 mg total) by mouth daily with breakfast. 90  capsule 3   No current facility-administered medications for this visit.    Allergies as of 08/26/2015 - Review Complete 08/26/2015  Allergen Reaction Noted  . Keflex [cephalexin] Anaphylaxis and Nausea And Vomiting 06/28/2014  . Macrobid [nitrofurantoin monohyd macro] Itching 06/28/2014    Vitals: BP 112/69 mmHg  Pulse 86  Ht 5\' 3"  (1.6 m)  Wt 110 lb 3.2 oz (49.986 kg)  BMI 19.53 kg/m2 Last Weight:  Wt Readings from Last 1 Encounters:  08/26/15 110 lb 3.2 oz (49.986 kg)   Last Height:   Ht Readings from Last 1 Encounters:  08/26/15 5\' 3"  (1.6 m)  Physical exam: Exam: Gen: NAD, conversant, well nourised, obese, well groomed                     CV: RRR, no MRG. No Carotid Bruits. No peripheral edema, warm, nontender Eyes: Conjunctivae clear without exudates or hemorrhage  Neuro: Detailed Neurologic Exam  Speech:    Speech is normal; fluent and spontaneous with normal comprehension.  Cognition:    The patient is oriented to person, place, and time;     recent and remote memory intact;     language fluent;     normal attention, concentration,     fund of knowledge Cranial Nerves:    The pupils are equal, round, and reactive to light. The fundi are flat Visual fields are full to finger confrontation. Extraocular movements are intact. Trigeminal sensation is intact and the muscles of mastication are normal. The face is symmetric. The palate elevates in the midline. Hearing intact. Voice is normal. Shoulder shrug is normal. The tongue has normal motion without fasciculations.   Coordination:    Normal finger to nose and heel to shin. Normal rapid alternating movements.   Gait:    Heel-toe and tandem gait are normal.   Motor Observation:    No asymmetry, no atrophy, and no involuntary movements noted. Tone:    Normal muscle tone.    Posture:    Posture is normal. normal erect    Strength:    Strength is V/V in the upper and lower limbs.      Sensation: intact  to LT     Reflex Exam:  DTR's:    Deep tendon reflexes in the upper and lower extremities are brisk bilaterally.   Toes:    The toes are downgoing bilaterally.   Clonus:    Clonus is absent.       Assessment/Plan:  65 year old female with migraines with aura, without status, not intractable. Dizziness and vertigo are very common in migraine disorders.  MRi of the brain was unremarkable. She does not have vascular risk factors, will hold off on MRA to detect stenosis or occlusion of the posterior circulation.  She is under quite a bit of stress. Will start Venlafaxine low dose which is a migraine/headache preventative and may help with the stress she is going through as well. She can also try Riboflavin 400 mg/day (Vitamin B2) (every day), Magnesium 400-600 mg (trigmagnesium dicitrate) daily (every day).   For Venlafaxine: provided UpToDate patient drug information handout and also discussed the following: .Call your doctor at once if you have: blurred vision, tunnel vision, eye pain or swelling, or seeing halos around lights; easy bruising, unusual bleeding (nose, mouth, vagina, or rectum), purple or red pinpoint spots under your skin; cough, chest tightness, trouble breathing; seizure (convulsions); high levels of serotonin in the body - agitation, hallucinations, fever, fast heart rate, overactive reflexes, nausea, vomiting, diarrhea, loss of coordination, fainting; low levels of sodium in the body - headache, confusion, slurred speech, severe weakness, vomiting, loss of coordination, feeling unsteady; or severe nervous system reaction - very stiff (rigid) muscles, high fever, sweating, confusion, fast or uneven heartbeats, tremors, feeling like you might pass out. Common venlafaxine side effects may include: vision changes; nausea, vomiting, diarrhea; changes in appetite or weight; dry mouth, yawning; dizziness, headache, anxiety, feeling nervous; fast heartbeats, tremors or  shaking; sleep problems (insomnia), strange dreams, tired feeling; increased sweating; or decreased sex drive, impotence, or difficulty having an orgasm.   To prevent  or relieve headaches, try the following: Cool Compress. Lie down and place a cool compress on your head.  Avoid headache triggers. If certain foods or odors seem to have triggered your migraines in the past, avoid them. A headache diary might help you identify triggers.  Include physical activity in your daily routine. Try a daily walk or other moderate aerobic exercise.  Manage stress. Find healthy ways to cope with the stressors, such as delegating tasks on your to-do list.  Practice relaxation techniques. Try deep breathing, yoga, massage and visualization.  Eat regularly. Eating regularly scheduled meals and maintaining a healthy diet might help prevent headaches. Also, drink plenty of fluids.  Follow a regular sleep schedule. Sleep deprivation might contribute to headaches Consider biofeedback. With this mind-body technique, you learn to control certain bodily functions - such as muscle tension, heart rate and blood pressure - to prevent headaches or reduce headache pain.    Proceed to emergency room if you experience new or worsening symptoms or symptoms do not resolve, if you have new neurologic symptoms or if headache is severe, or for any concerning symptom.   CC: Dr Thornell Mule and Dr. Audelia Acton, [facility] [address] [place], [place] [address]  Phone [phone] Fax [phone]

## 2015-09-18 ENCOUNTER — Ambulatory Visit
Admission: RE | Admit: 2015-09-18 | Discharge: 2015-09-18 | Disposition: A | Discharge status: Home / Self Care | Payer: PRIVATE HEALTH INSURANCE | Source: Ambulatory Visit

## 2015-09-18 DIAGNOSIS — Z1231 Encounter for screening mammogram for malignant neoplasm of breast: Secondary | ICD-10-CM

## 2015-12-16 ENCOUNTER — Telehealth: Payer: Self-pay | Admitting: Neurology

## 2015-12-16 MED ORDER — VENLAFAXINE HCL ER 37.5 MG PO CP24: 37.5000 mg | ORAL_CAPSULE | Freq: Every day | ORAL | Status: DC

## 2015-12-16 NOTE — Telephone Encounter (Signed)
Pt called requesting refill for venlafaxine XR (EFFEXOR-XR) 37.5 MG 24 hr capsule 90 day supply. Pt's insurance has changed to [employer]  ID# [id number]  BIN# [id number]  GRP# [id number]. Please [facility] to [facility]. No further RX to [place]

## 2016-01-01 ENCOUNTER — Ambulatory Visit (INDEPENDENT_AMBULATORY_CARE_PROVIDER_SITE_OTHER): Payer: BLUE CROSS/BLUE SHIELD | Admitting: Neurology

## 2016-01-01 ENCOUNTER — Encounter: Payer: Self-pay | Admitting: Neurology

## 2016-01-01 VITALS — BP 112/71 | HR 82 | Ht 63.0 in | Wt 116.0 lb

## 2016-01-01 DIAGNOSIS — G43101 Migraine with aura, not intractable, with status migrainosus: Secondary | ICD-10-CM | POA: Diagnosis not present

## 2016-01-01 DIAGNOSIS — G43909 Migraine, unspecified, not intractable, without status migrainosus: Secondary | ICD-10-CM | POA: Diagnosis not present

## 2016-01-01 MED ORDER — SUMATRIPTAN SUCCINATE 11 MG/NOSEPC NA EXHP: 2.0000 | INHALANT_POWDER | Freq: Once | NASAL | Status: DC

## 2016-01-01 MED ORDER — ONDANSETRON 4 MG PO TBDP: 4.0000 mg | ORAL_TABLET | Freq: Three times a day (TID) | ORAL | Status: DC | PRN

## 2016-01-01 NOTE — Progress Notes (Signed)
[id number]    Provider:  Dr Jaynee Eagles Referring Provider: Mayra Neer, MD Primary Care Physician:  Mayra Neer, MD  Provider: Dr Jaynee Eagles Referring Provider: Vicie Mutters, MD Primary Care Physician: Mayra Neer, MD  CC: Vestibular migraine  Interval History 01/01/2016:  She feels better. She still has dizziness. She still has headaches but they are different, she gets facial tingling. She has some nausea with the headaches now. 2 days prior to headache she would get some vision changes like a flash instead of the aura. She does nto want to increase the effexor. If she forgets to take it she feels very dizzy. She is having 2-3 mgraines a month. They can last 24 hours or a few days. She treats migraines with advil and takes it easy. She doesn;t like the way Frova makes her feel.   HPI: Emily White is a 65 y.o. female here as a referral from Dr. Thornell Mule for vestibular migraines. PMHx migraine, anxiety. She has a lot of stress in her life. Her husband had lung cancer and has a daughter who is an alcoholic. She takes frova and doesn't like the way she feels. She saw Dr. Thornell Mule for dizziness. For 6-12 months she has transient dizziness, when she looks down feels like she is going to fall, feelings of nausea like she is sea sick and has to stare at something and not move. She was having ear pain and crackling in the ears, tinnitus and she went to see Dr. Thornell Mule whose evaluation was vestibular migraines. She has migraines, she had one this past weekend in the middle of the night. She will get fatigue a day or two before, muscle aches in the neck, speech doesn;t come out right, she will drop things, she will have loose bowel movements. She will have extreme acuity of vision and then the view focuses in then zig zag lines in the vision for 30 minutes and then sometimes it is followed by a headache. Headache pain will start on the left and extend on the left side of the face, tingling  around the eyes and mouth, may switch sides. It can a day or a week. She has 14 headaches days a week, 4 migraines a month. She has nausea, no vomiting. Had a side effect to topamax. Aunts with PD and Alzheimers. Older brother with migraine and mother with migraine. No other focal neurologic deficits.  Reviewed notes, labs and imaging from outside physicians, which showed:  MRi of the brain, personally reviewed images and agree with following: There is no evidence of acute infarct, intracranial hemorrhage, mass, midline shift, or extra-axial fluid collection. No significant white matter disease is seen for age. Ventricles and sulci are normal. No abnormal brain parenchymal or meningeal enhancement is identified.  Orbits are unremarkable. There is mild bilateral ethmoid air cell and left maxillary sinus mucosal thickening. No significant mastoid effusion is seen. Major intracranial vascular flow voids are preserved.  Dedicated imaging through the internal auditory canals demonstrates a normal course of cranial nerves VII and VIII without evidence of mass or abnormal enhancement. Inner ear structures demonstrate normal signal bilaterally. No mass is seen within the cerebellopontine angles.  IMPRESSION: Unremarkable appearance of the brain and internal auditory canals.  Review of Systems: Patient complains of symptoms per HPI as well as the following symptoms: fatigue, eye pain, palpitations, wheezing, rinign in ears, spinning sensation, constipation, aching muscles, headache, dizziness, insomnia, anxiety, decreased energy. Pertinent negatives per HPI. All others negative.   Social History  Social History  . Marital Status: Married    Spouse Name: Richard   . Number of Children: 3  . Years of Education: 16   Occupational History  . Not on file.   Social History Main Topics  . Smoking status: Former Smoker -- 2.00 packs/day for 5 years    Types: Cigarettes    Quit date: 11/16/1968  .  Smokeless tobacco: Never Used  . Alcohol Use: Yes     Comment: 1 glass wine per day  . Drug Use: No  . Sexual Activity: Not on file   Other Topics Concern  . Not on file   Social History Narrative   Lives at home with husband, Delfino Lovett.    Caffeine use: 1 cup coffee per day       Family History  Problem Relation Age of Onset  . Migraines Mother   . Migraines Brother     Past Medical History  Diagnosis Date  . Asthma   . Anxiety   . GERD (gastroesophageal reflux disease)     asymptomatic  . Headache(784.0)     migraine  . Arthritis   . Nausea and vomiting     After surgery  . IBS (irritable bowel syndrome)   . Migraine     Past Surgical History  Procedure Laterality Date  . Right ovarine cyst removed  1983  . Abdominal hysterectomy  2000    complete  . Appendectomy    . Colonoscopy with propofol N/A 06/28/2014    Procedure: COLONOSCOPY WITH PROPOFOL;  Surgeon: Cleotis Nipper, MD;  Location: [facility];  Service: Endoscopy;  Laterality: N/A;    Current Outpatient Prescriptions  Medication Sig Dispense Refill  . albuterol (PROVENTIL HFA;VENTOLIN HFA) 108 (90 BASE) MCG/ACT inhaler Inhale 1 puff into the lungs every 6 (six) hours as needed for wheezing or shortness of breath.    . ALPRAZolam (XANAX) 0.25 MG tablet Take 0.25 mg by mouth 3 (three) times daily as needed for anxiety.    Marland Kitchen aspirin EC 81 MG tablet Take 81 mg by mouth daily.    Marland Kitchen b complex vitamins tablet Take 1 tablet by mouth daily.    Marland Kitchen BIOTIN PO Take 1 capsule by mouth daily.    . Calcium-Magnesium (CAL-MAG PO) Take 1 tablet by mouth daily. 500-250mg     . Cholecalciferol (VITAMIN D) 2000 UNITS tablet Take 4,000 Units by mouth daily.     . Digestive Enzymes (DIGESTIVE ENZYME PO) Take 86 mg by mouth daily.    Marland Kitchen estradiol (ESTRACE) 0.5 MG tablet Take 0.5 mg by mouth daily.    . fluticasone (FLOVENT HFA) 44 MCG/ACT inhaler Inhale 2 puffs into the lungs every morning.    . frovatriptan (FROVA) 2.5 MG  tablet Take 2.5 mg by mouth daily as needed for migraine. If recurs, may repeat after 2 hours. Max of 3 tabs in 24 hours.    . Melatonin 1 MG TABS Take 1 tablet by mouth at bedtime as needed (for sleep).    Marland Kitchen OVER THE COUNTER MEDICATION Take 1 tablet by mouth daily. Astragalus-for immune function    . OVER THE COUNTER MEDICATION Take 1 tablet by mouth daily. Black current seed    . riboflavin (VITAMIN B-2) 100 MG TABS tablet Take 100 mg by mouth daily.    Marland Kitchen venlafaxine XR (EFFEXOR-XR) 37.5 MG 24 hr capsule Take 1 capsule (37.5 mg total) by mouth daily with breakfast. 90 capsule 2  . vitamin E 400 UNIT capsule Take  400 Units by mouth daily.     No current facility-administered medications for this visit.    Allergies as of 01/01/2016 - Review Complete 01/01/2016  Allergen Reaction Noted  . Keflex [cephalexin] Anaphylaxis and Nausea And Vomiting 06/28/2014  . Macrobid [nitrofurantoin monohyd macro] Itching 06/28/2014    Vitals: BP 112/71 mmHg  Pulse 82  Ht 5\' 3"  (1.6 m)  Wt 116 lb (52.617 kg)  BMI 20.55 kg/m2 Last Weight:  Wt Readings from Last 1 Encounters:  01/01/16 116 lb (52.617 kg)   Last Height:   Ht Readings from Last 1 Encounters:  01/01/16 5\' 3"  (1.6 m)    Cranial Nerves:  The pupils are equal, round, and reactive to light. The fundi are flat Visual fields are full to finger confrontation. Extraocular movements are intact. Trigeminal sensation is intact and the muscles of mastication are normal. The face is symmetric. The palate elevates in the midline. Hearing intact. Voice is normal. Shoulder shrug is normal. The tongue has normal motion without fasciculations.   Coordination:  Normal finger to nose and heel to shin. Normal rapid alternating movements.   Gait:  Heel-toe and tandem gait are normal.   Motor Observation:  No asymmetry, no atrophy, and no involuntary movements noted. Tone:  Normal muscle tone.   Posture:  Posture is normal. normal  erect   Strength:  Strength is V/V in the upper and lower limbs.    Sensation: intact to LT   Reflex Exam:  DTR's:  Deep tendon reflexes in the upper and lower extremities are brisk bilaterally.  Toes:  The toes are downgoing bilaterally.  Clonus:  Clonus is absent.      Assessment/Plan: 65 year old female with migraines with aura, without status, not intractable. Dizziness and vertigo are very common in migraine disorders. MRi of the brain was unremarkable. She does not have vascular risk factors, will hold off on MRA to detect stenosis or occlusion of the posterior circulation.  She is under quite a bit of stress. Will continue Venlafaxine low dose which is a migraine/headache preventative and may help with the stress she is going through as well. She can also try Riboflavin 400 mg/day (Vitamin B2) (every day), Magnesium 400-600 mg (trigmagnesium dicitrate) daily (every day).  As far as your medications are concerned, I would like to suggest: Onzetra at onset of headache may repeat in 2 hours.  Continue Venlafaxine Cambia at onset with zofran for migraine. Take with food. Stop for GI upset or dark stools and call physician.   I would like to see you back in 4-6 months, sooner if we need to. Please call us with any interim questions, concerns, problems, updates or refill requests.    To prevent or relieve headaches, try the following: Cool Compress. Lie down and place a cool compress on your head.  Avoid headache triggers. If certain foods or odors seem to have triggered your migraines in the past, avoid them. A headache diary might help you identify triggers.  Include physical activity in your daily routine. Try a daily walk or other moderate aerobic exercise.  Manage stress. Find healthy ways to cope with the stressors, such as delegating tasks on your to-do list.  Practice relaxation techniques. Try deep breathing, yoga, massage and visualization.    Eat regularly. Eating regularly scheduled meals and maintaining a healthy diet might help prevent headaches. Also, drink plenty of fluids.  Follow a regular sleep schedule. Sleep deprivation might contribute to headaches Consider biofeedback. With this  mind-body technique, you learn to control certain bodily functions - such as muscle tension, heart rate and blood pressure - to prevent headaches or reduce headache pain.    Proceed to emergency room if you experience new or worsening symptoms or symptoms do not resolve, if you have new neurologic symptoms or if headache is severe, or for any concerning symptom.     Assessment/Plan:    Sarina Ill, MD  [facility] [address] [place], [place] [address]  Phone [phone] Fax [phone]  A total of 30 minutes was spent face-to-face with this patient. Over half this time was spent on counseling patient on the vestibular migraine diagnosis and different diagnostic and therapeutic options available.

## 2016-01-01 NOTE — Patient Instructions (Signed)
Remember to drink plenty of fluid, eat healthy meals and do not skip any meals. Try to eat protein with a every meal and eat a healthy snack such as fruit or nuts in between meals. Try to keep a regular sleep-wake schedule and try to exercise daily, particularly in the form of walking, 20-30 minutes a day, if you can.   As far as your medications are concerned, I would like to suggest: Onzetra at onset of headache may repeat in 2 hours.  Continue Venlafaxine Cambia at onset with zofran for migraine.  I would like to see you back in 4-6 months, sooner if we need to. Please call us with any interim questions, concerns, problems, updates or refill requests.   Our phone number is [phone]. We also have an after hours call service for urgent matters and there is a physician on-call for urgent questions. For any emergencies you know to call 911 or go to the nearest emergency room

## 2016-01-02 ENCOUNTER — Other Ambulatory Visit: Payer: Self-pay | Admitting: *Deleted

## 2016-01-02 ENCOUNTER — Encounter: Payer: Self-pay | Admitting: *Deleted

## 2016-01-02 DIAGNOSIS — G43101 Migraine with aura, not intractable, with status migrainosus: Secondary | ICD-10-CM

## 2016-01-02 DIAGNOSIS — G43909 Migraine, unspecified, not intractable, without status migrainosus: Secondary | ICD-10-CM

## 2016-01-02 MED ORDER — SUMATRIPTAN SUCCINATE 11 MG/NOSEPC NA EXHP
2.0000 | INHALANT_POWDER | Freq: Once | NASAL | Status: DC
Start: 2016-01-02 — End: 2017-01-14

## 2016-01-02 NOTE — Progress Notes (Signed)
Faxed onzetra enrollment, last office note, rx onzetra to [facility] ([employer]). Received confirmation. Fax: [phone]. Sent copy of completed form to medical records.

## 2016-01-06 ENCOUNTER — Telehealth: Payer: Self-pay | Admitting: Neurology

## 2016-01-06 NOTE — Telephone Encounter (Signed)
Emily White with [facility] [phone] called said Emily White has been approved. She faxed approval to [facility]

## 2016-01-07 NOTE — Telephone Encounter (Signed)
Received approval letter for onzetra. Effective dates of authorization:  01/06/2016-11/15/2018.  Reference number: [id number]. Letter dated 01/06/2016.

## 2016-03-26 ENCOUNTER — Other Ambulatory Visit: Payer: Self-pay | Admitting: Obstetrics and Gynecology

## 2016-03-27 LAB — CYTOLOGY - PAP

## 2016-09-11 ENCOUNTER — Other Ambulatory Visit: Payer: Self-pay | Admitting: Neurology

## 2016-09-24 ENCOUNTER — Other Ambulatory Visit: Payer: Self-pay | Admitting: Obstetrics and Gynecology

## 2016-09-24 DIAGNOSIS — Z1231 Encounter for screening mammogram for malignant neoplasm of breast: Secondary | ICD-10-CM

## 2016-10-29 ENCOUNTER — Ambulatory Visit: Payer: PRIVATE HEALTH INSURANCE

## 2016-12-03 ENCOUNTER — Other Ambulatory Visit: Payer: Self-pay | Admitting: Neurology

## 2016-12-03 ENCOUNTER — Ambulatory Visit: Payer: PRIVATE HEALTH INSURANCE

## 2016-12-07 ENCOUNTER — Telehealth: Payer: Self-pay | Admitting: Neurology

## 2016-12-07 MED ORDER — VENLAFAXINE HCL ER 37.5 MG PO CP24: 37.5000 mg | ORAL_CAPSULE | Freq: Every day | ORAL | 1 refills | Status: DC

## 2016-12-07 NOTE — Telephone Encounter (Signed)
Dr Jaynee Eagles- [facility]. Gave enough to get her to her f/u appt. She hasn't been seen in almost a year

## 2016-12-07 NOTE — Addendum Note (Signed)
Addended by: Rossie Muskrat L on: 12/07/2016 11:44 AM   Modules accepted: Orders

## 2016-12-07 NOTE — Telephone Encounter (Signed)
Sent rx with 1 refill to get her to her f/u appt.

## 2016-12-07 NOTE — Telephone Encounter (Signed)
Patient requesting refill for venlafaxine XR (EFFEXOR-XR) 37.5 MG 24 hr capsule.  Appointment scheduled 01/14/17 with Dr. Jaynee Eagles.

## 2016-12-24 ENCOUNTER — Ambulatory Visit
Admission: RE | Admit: 2016-12-24 | Discharge: 2016-12-24 | Disposition: A | Discharge status: Home / Self Care | Payer: BLUE CROSS/BLUE SHIELD | Source: Ambulatory Visit | Attending: Obstetrics and Gynecology | Admitting: Obstetrics and Gynecology

## 2016-12-24 DIAGNOSIS — Z1231 Encounter for screening mammogram for malignant neoplasm of breast: Secondary | ICD-10-CM

## 2017-01-14 ENCOUNTER — Ambulatory Visit (INDEPENDENT_AMBULATORY_CARE_PROVIDER_SITE_OTHER): Payer: BLUE CROSS/BLUE SHIELD | Admitting: Neurology

## 2017-01-14 ENCOUNTER — Encounter: Payer: Self-pay | Admitting: Neurology

## 2017-01-14 VITALS — BP 121/74 | HR 84 | Ht 63.0 in | Wt 103.6 lb

## 2017-01-14 DIAGNOSIS — G43009 Migraine without aura, not intractable, without status migrainosus: Secondary | ICD-10-CM | POA: Diagnosis not present

## 2017-01-14 DIAGNOSIS — F4320 Adjustment disorder, unspecified: Secondary | ICD-10-CM

## 2017-01-14 DIAGNOSIS — F4321 Adjustment disorder with depressed mood: Secondary | ICD-10-CM

## 2017-01-14 MED ORDER — VENLAFAXINE HCL ER 37.5 MG PO CP24: 37.5000 mg | ORAL_CAPSULE | Freq: Every day | ORAL | 5 refills | Status: DC

## 2017-01-14 NOTE — Progress Notes (Signed)
[id number]   Provider: Dr Jaynee Eagles Referring Provider: Vicie Mutters, MD Primary Care Physician: Mayra Neer, MD  CC: Vestibular migraine  Interval history 01/14/2017:  Once a month she has a headache and she takes a few tylenol or a few advil. It usually takes care of it. If she sleeps she feels better. Her husband died and she has been in grief therapy.She has had a few episodes of aura without headache rare dizziness. She has not needed her Frova. She is legally blind. Othrewise doing well. We discussed grief and grief therapy since the death of her husband and how it may affect migraines.   Interval History 01/01/2016:  She feels better. She still has dizziness. She still has headaches but they are different, she gets facial tingling. She has some nausea with the headaches now. 2 days prior to headache she would get some vision changes like a flash instead of the aura. She does nto want to increase the effexor. If she forgets to take it she feels very dizzy. She is having 2-3 mgraines a month. They can last 24 hours or a few days. She treats migraines with advil and takes it easy. She doesn;t like the way Frova makes her feel.   HPI: Emily White is a 66 y.o. female here as a referral from Dr. Thornell Mule for vestibular migraines. PMHx migraine, anxiety. She has a lot of stress in her life. Her husband had lung cancer and has a daughter who is an alcoholic. She takes frova and doesn't like the way she feels. She saw Dr. Thornell Mule for dizziness. For 6-12 months she has transient dizziness, when she looks down feels like she is going to fall, feelings of nausea like she is sea sick and has to stare at something and not move. She was having ear pain and crackling in the ears, tinnitus and she went to see Dr. Thornell Mule whose evaluation was vestibular migraines. She has migraines, she had one this past weekend in the middle of the night. She will get fatigue a day or two before, muscle aches  in the neck, speech doesn;t come out right, she will drop things, she will have loose bowel movements. She will have extreme acuity of vision and then the view focuses in then zig zag lines in the vision for 30 minutes and then sometimes it is followed by a headache. Headache pain will start on the left and extend on the left side of the face, tingling around the eyes and mouth, may switch sides. It can a day or a week. She has 14 headaches days a week, 4 migraines a month. She has nausea, no vomiting. Had a side effect to topamax. Aunts with PD and Alzheimers. Older brother with migraine and mother with migraine. No other focal neurologic deficits.  Reviewed notes, labs and imaging from outside physicians, which showed:  MRi of the brain, personally reviewed images and agree with following: There is no evidence of acute infarct, intracranial hemorrhage, mass, midline shift, or extra-axial fluid collection. No significant white matter disease is seen for age. Ventricles and sulci are normal. No abnormal brain parenchymal or meningeal enhancement is identified.  Orbits are unremarkable. There is mild bilateral ethmoid air cell and left maxillary sinus mucosal thickening. No significant mastoid effusion is seen. Major intracranial vascular flow voids are preserved.  Dedicated imaging through the internal auditory canals demonstrates a normal course of cranial nerves VII and VIII without evidence of mass or abnormal enhancement. Inner ear structures  demonstrate normal signal bilaterally. No mass is seen within the cerebellopontine angles.  IMPRESSION: Unremarkable appearance of the brain and internal auditory canals.  Review of Systems: Patient complains of symptoms per HPI as well as the following symptoms: fatigue, eye pain, palpitations, wheezing, rinign in ears, spinning sensation, constipation, aching muscles, headache, dizziness, insomnia, anxiety, decreased energy. Pertinent negatives per  HPI. All others negative.  Social History   Social History  . Marital status: Widowed    Spouse name: Richard   . Number of children: 3  . Years of education: 58   Occupational History  . Not on file.   Social History Main Topics  . Smoking status: Former Smoker    Packs/day: 2.00    Years: 5.00    Types: Cigarettes    Quit date: 11/16/1968  . Smokeless tobacco: Never Used  . Alcohol use Yes     Comment: 1 glass wine per day  . Drug use: No  . Sexual activity: Not on file   Other Topics Concern  . Not on file   Social History Narrative   Lives at home with husband, Delfino Lovett.    Caffeine use: 1 cup coffee per day   Right-handed       Family History  Problem Relation Age of Onset  . Migraines Mother   . Migraines Brother   . Breast cancer Maternal Aunt     Past Medical History:  Diagnosis Date  . Anxiety   . Arthritis   . Asthma   . GERD (gastroesophageal reflux disease)    asymptomatic  . Headache(784.0)    migraine  . IBS (irritable bowel syndrome)   . Migraine   . Nausea and vomiting    After surgery    Past Surgical History:  Procedure Laterality Date  . ABDOMINAL HYSTERECTOMY  2000   complete  . APPENDECTOMY    . COLONOSCOPY WITH PROPOFOL N/A 06/28/2014   Procedure: COLONOSCOPY WITH PROPOFOL;  Surgeon: Cleotis Nipper, MD;  Location: [facility];  Service: Endoscopy;  Laterality: N/A;  . OVARIAN CYST REMOVAL Right 1983    Current Outpatient Prescriptions  Medication Sig Dispense Refill  . albuterol (PROVENTIL HFA;VENTOLIN HFA) 108 (90 BASE) MCG/ACT inhaler Inhale 1 puff into the lungs every 6 (six) hours as needed for wheezing or shortness of breath.    . ALPRAZolam (XANAX) 0.25 MG tablet Take 0.25 mg by mouth 3 (three) times daily as needed for anxiety.    Marland Kitchen aspirin EC 81 MG tablet Take 81 mg by mouth daily.    Marland Kitchen b complex vitamins tablet Take 1 tablet by mouth daily.    Marland Kitchen BIOTIN PO Take 1 capsule by mouth daily.    . Calcium-Magnesium  (CAL-MAG PO) Take 1 tablet by mouth daily. 500-250mg     . Cholecalciferol (VITAMIN D) 2000 UNITS tablet Take 4,000 Units by mouth daily.     . Digestive Enzymes (DIGESTIVE ENZYME PO) Take 86 mg by mouth daily.    Marland Kitchen estradiol (ESTRACE) 0.5 MG tablet Take 0.5 mg by mouth daily.    . fluticasone (FLOVENT HFA) 44 MCG/ACT inhaler Inhale 2 puffs into the lungs every morning.    . Melatonin 1 MG TABS Take 1 tablet by mouth at bedtime as needed (for sleep).    . ondansetron (ZOFRAN ODT) 4 MG disintegrating tablet Take 1 tablet (4 mg total) by mouth every 8 (eight) hours as needed for nausea or vomiting. 20 tablet 12  . OVER THE COUNTER MEDICATION Take 1  tablet by mouth daily. Astragalus-for immune function    . OVER THE COUNTER MEDICATION Take 1 tablet by mouth daily. Black current seed    . riboflavin (VITAMIN B-2) 100 MG TABS tablet Take 100 mg by mouth daily.    Marland Kitchen venlafaxine XR (EFFEXOR-XR) 37.5 MG 24 hr capsule Take 1 capsule (37.5 mg total) by mouth daily with breakfast. 30 capsule 1  . vitamin E 400 UNIT capsule Take 400 Units by mouth daily.     No current facility-administered medications for this visit.     Allergies as of 01/14/2017 - Review Complete 01/14/2017  Allergen Reaction Noted  . Keflex [cephalexin] Anaphylaxis and Nausea And Vomiting 06/28/2014  . Macrobid [nitrofurantoin monohyd macro] Nausea And Vomiting 06/28/2014    Vitals: Ht 5\' 3"  (1.6 m)   Wt 103 lb 9.6 oz (47 kg)   BMI 18.35 kg/m  Last Weight:  Wt Readings from Last 1 Encounters:  01/14/17 103 lb 9.6 oz (47 kg)   Last Height:   Ht Readings from Last 1 Encounters:  01/14/17 5\' 3"  (1.6 m)       Cranial Nerves:  The pupils are equal, round, and reactive to light. The fundi are flat Visual fields are full to finger confrontation. Extraocular movements are intact. Trigeminal sensation is intact and the muscles of mastication are normal. The face is symmetric. The palate elevates in the midline. Hearing intact.  Voice is normal. Shoulder shrug is normal. The tongue has normal motion without fasciculations.   Coordination:  Normal finger to nose and heel to shin. Normal rapid alternating movements.   Gait:  Heel-toe and tandem gait are normal.   Motor Observation:  No asymmetry, no atrophy, and no involuntary movements noted. Tone:  Normal muscle tone.   Posture:  Posture is normal. normal erect   Strength:  Strength is V/V in the upper and lower limbs.    Sensation: intact to LT   Reflex Exam:  DTR's:  Deep tendon reflexes in the upper and lower extremities are brisk bilaterally.  Toes:  The toes are downgoing bilaterally.  Clonus:  Clonus is absent.      Assessment/Plan: 66 year old female with migraines with aura, without status, not intractable. Dizziness and vertigo are very common in migraine disorders. MRi of the brain was unremarkable. She does not have vascular risk factors, will hold off on MRA to detect stenosis or occlusion of the posterior circulation.  Her husband passed, she is greiving. Will continue Venlafaxine low dose which is a migraine/headache preventative and may help with the stress she is going through as well. She can also try Riboflavin 400 mg/day (Vitamin B2) (every day), Magnesium 400-600 mg (trigmagnesium dicitrate) daily (every day).  As far as your medications are concerned, I would like to suggest: Frova at onset of headache may repeat in 2 hours.  Continue Venlafaxine Cambia at onset with zofran for migraine. Take with food. Stop for GI upset or dark stools and call physician.   I would like to see you back in 1 year, sooner if we need to. Please call us with any interim questions, concerns, problems, updates or refill requests.  Discussed: To prevent or relieve headaches, try the following: Cool Compress. Lie down and place a cool compress on your head.  Avoid headache triggers. If certain foods  or odors seem to have triggered your migraines in the past, avoid them. A headache diary might help you identify triggers.  Include physical activity in your daily  routine. Try a daily walk or other moderate aerobic exercise.  Manage stress. Find healthy ways to cope with the stressors, such as delegating tasks on your to-do list.  Practice relaxation techniques. Try deep breathing, yoga, massage and visualization.  Eat regularly. Eating regularly scheduled meals and maintaining a healthy diet might help prevent headaches. Also, drink plenty of fluids.  Follow a regular sleep schedule. Sleep deprivation might contribute to headaches Consider biofeedback. With this mind-body technique, you learn to control certain bodily functions - such as muscle tension, heart rate and blood pressure - to prevent headaches or reduce headache pain.    Proceed to emergency room if you experience new or worsening symptoms or symptoms do not resolve, if you have new neurologic symptoms or if headache is severe, or for any concerning symptom.     Sarina Ill, MD  [facility] [address] [place], [place] [address]  Phone [phone] Fax [phone]  A total of 30 minutes was spent face-to-face with this patient. Over half this time was spent on counseling patient on the migraine, grief diagnosis and different diagnostic and therapeutic options available.

## 2017-01-14 NOTE — Patient Instructions (Signed)
Remember to drink plenty of fluid, eat healthy meals and do not skip any meals. Try to eat protein with a every meal and eat a healthy snack such as fruit or nuts in between meals. Try to keep a regular sleep-wake schedule and try to exercise daily, particularly in the form of walking, 20-30 minutes a day, if you can.   As far as your medications are concerned, I would like to suggest: Continue Venlafaxine (Effexor)  I would like to see you back as needed, sooner if we need to. Please call us with any interim questions, concerns, problems, updates or refill requests.   Our phone number is [phone]. We also have an after hours call service for urgent matters and there is a physician on-call for urgent questions. For any emergencies you know to call 911 or go to the nearest emergency room

## 2017-02-05 ENCOUNTER — Other Ambulatory Visit: Payer: Self-pay | Admitting: Physician Assistant

## 2017-02-05 ENCOUNTER — Ambulatory Visit
Admission: RE | Admit: 2017-02-05 | Discharge: 2017-02-05 | Disposition: A | Discharge status: Home / Self Care | Payer: BLUE CROSS/BLUE SHIELD | Source: Ambulatory Visit | Attending: Physician Assistant | Admitting: Physician Assistant

## 2017-02-05 DIAGNOSIS — R05 Cough: Secondary | ICD-10-CM

## 2017-02-05 DIAGNOSIS — R059 Cough, unspecified: Secondary | ICD-10-CM

## 2017-02-05 DIAGNOSIS — R0602 Shortness of breath: Secondary | ICD-10-CM

## 2017-02-05 DIAGNOSIS — R062 Wheezing: Secondary | ICD-10-CM

## 2018-01-14 DIAGNOSIS — J029 Acute pharyngitis, unspecified: Secondary | ICD-10-CM | POA: Diagnosis not present

## 2018-01-14 DIAGNOSIS — Z202 Contact with and (suspected) exposure to infections with a predominantly sexual mode of transmission: Secondary | ICD-10-CM | POA: Diagnosis not present

## 2018-03-03 ENCOUNTER — Other Ambulatory Visit: Payer: Self-pay | Admitting: Obstetrics and Gynecology

## 2018-03-03 DIAGNOSIS — Z1231 Encounter for screening mammogram for malignant neoplasm of breast: Secondary | ICD-10-CM

## 2018-03-07 ENCOUNTER — Ambulatory Visit
Admission: RE | Admit: 2018-03-07 | Discharge: 2018-03-07 | Disposition: A | Discharge status: Home / Self Care | Payer: Medicare Other | Source: Ambulatory Visit | Attending: Obstetrics and Gynecology | Admitting: Obstetrics and Gynecology

## 2018-03-07 DIAGNOSIS — Z1231 Encounter for screening mammogram for malignant neoplasm of breast: Secondary | ICD-10-CM | POA: Diagnosis not present

## 2018-03-25 DIAGNOSIS — R350 Frequency of micturition: Secondary | ICD-10-CM | POA: Diagnosis not present

## 2018-03-28 ENCOUNTER — Other Ambulatory Visit: Payer: Self-pay | Admitting: Neurology

## 2018-03-30 ENCOUNTER — Telehealth: Payer: Self-pay | Admitting: Neurology

## 2018-03-30 DIAGNOSIS — E46 Unspecified protein-calorie malnutrition: Secondary | ICD-10-CM | POA: Diagnosis not present

## 2018-03-30 DIAGNOSIS — F411 Generalized anxiety disorder: Secondary | ICD-10-CM | POA: Diagnosis not present

## 2018-03-30 DIAGNOSIS — K589 Irritable bowel syndrome without diarrhea: Secondary | ICD-10-CM | POA: Diagnosis not present

## 2018-03-30 NOTE — Telephone Encounter (Signed)
Spoke with patient and discussed that a f/u appt is needed for this year. She verbalized understanding and scheduled appt for 06/15/18 @ 08:30 arrival time 08:00. She is aware that she can get a refill for her med to get through her appt but she will need to be seen for further refills.

## 2018-03-30 NOTE — Telephone Encounter (Signed)
Pt states she just went to pick up her refill of the venlafaxine XR . She states it is normally for 90 day but this was for a 30.  Pt states she went ahead and purchased it (has not been opened) she is asking for a call to see how to go about getting the 90 day.  Pt states she symptoms are being well controlled by this medication.  Please call

## 2018-04-21 ENCOUNTER — Other Ambulatory Visit: Payer: Self-pay | Admitting: Neurology

## 2018-05-08 ENCOUNTER — Encounter (HOSPITAL_COMMUNITY): Payer: Self-pay | Admitting: *Deleted

## 2018-05-08 ENCOUNTER — Ambulatory Visit (HOSPITAL_COMMUNITY)
Admission: EM | Admit: 2018-05-08 | Discharge: 2018-05-08 | Disposition: A | Discharge status: Home / Self Care | Payer: Medicare Other | Attending: Family Medicine | Admitting: Family Medicine

## 2018-05-08 DIAGNOSIS — S0591XA Unspecified injury of right eye and orbit, initial encounter: Secondary | ICD-10-CM | POA: Diagnosis not present

## 2018-05-08 HISTORY — DX: Abnormal weight loss: R63.4

## 2018-05-08 MED ORDER — TETRACAINE HCL 0.5 % OP SOLN
OPHTHALMIC | Status: AC
  Filled 2018-05-08: qty 4

## 2018-05-08 MED ORDER — OFLOXACIN 0.3 % OP SOLN: OPHTHALMIC | 0 refills | Status: DC

## 2018-05-08 NOTE — Discharge Instructions (Signed)
Use ofloxacin eyedrops as directed on right eye. Artificial tear gel (systane/genteal gel) at night can help with symptoms. Wait 10-15 minutes between drops, always use artificial tear gel last, as it prevents drops from penetrating through. Monitor for any worsening of symptoms, changes in vision, sensitivity to light, eye swelling, painful eye movement, follow up with ophthalmology for further evaluation. Otherwise, follow up with ophthalmology next week for reevaluation to ensure healing.

## 2018-05-08 NOTE — ED Triage Notes (Signed)
Reports accidentally getting scratched in right eye by her dog today.  C/O discomfort and redness.  Denies any change in vision.

## 2018-05-08 NOTE — ED Provider Notes (Signed)
[place]    CSN: [id number] Arrival date & time: 05/08/18  1432     History   Chief Complaint Chief Complaint  Patient presents with  . Eye Injury    HPI Emily White is a 67 y.o. female.   67 year old female comes in for possible corneal abrasion.  States accidentally getting scratched  by her dog earlier today.  States has some discomfort with eye redness.  Denies changes in vision, photophobia.  Some eye watering.  Denies contact lens use.  Does wear glasses.  Up-to-date on tetanus.     Past Medical History:  Diagnosis Date  . Anxiety   . Arthritis   . Asthma   . GERD (gastroesophageal reflux disease)    asymptomatic  . Headache(784.0)    migraine  . IBS (irritable bowel syndrome)   . Migraine   . Nausea and vomiting    After surgery  . Weight loss     Patient Active Problem List   Diagnosis Date Noted  . Migraine with aura and without status migrainosus, not intractable 08/26/2015  . Vestibular migraine 08/26/2015    Past Surgical History:  Procedure Laterality Date  . ABDOMINAL HYSTERECTOMY  2000   complete  . APPENDECTOMY    . COLONOSCOPY WITH PROPOFOL N/A 06/28/2014   Procedure: COLONOSCOPY WITH PROPOFOL;  Surgeon: Cleotis Nipper, MD;  Location: [facility];  Service: Endoscopy;  Laterality: N/A;  . OVARIAN CYST REMOVAL Right 1983    OB History   None      Home Medications    Prior to Admission medications   Medication Sig Start Date End Date Taking? Authorizing Provider  albuterol (PROVENTIL HFA;VENTOLIN HFA) 108 (90 BASE) MCG/ACT inhaler Inhale 1 puff into the lungs every 6 (six) hours as needed for wheezing or shortness of breath.   Yes [provider]  ALPRAZolam (XANAX) 0.25 MG tablet Take 0.125 mg by mouth 3 (three) times daily as needed for anxiety.    Yes [provider]  aspirin EC 81 MG tablet Take 81 mg by mouth daily.   Yes [provider]  b complex vitamins tablet Take 1 tablet by  mouth daily.   Yes [provider]  BIOTIN PO Take 1 capsule by mouth daily.   Yes [provider]  Calcium-Magnesium (CAL-MAG PO) Take 1 tablet by mouth daily. 500-250mg    Yes [provider]  Cholecalciferol (VITAMIN D) 2000 UNITS tablet Take 4,000 Units by mouth daily.    Yes [provider]  Digestive Enzymes (DIGESTIVE ENZYME PO) Take 86 mg by mouth daily.   Yes [provider]  estradiol (ESTRACE) 0.5 MG tablet Take 0.5 mg by mouth daily.   Yes [provider]  fluticasone (FLOVENT HFA) 44 MCG/ACT inhaler Inhale 2 puffs into the lungs every morning.   Yes [provider]  Melatonin 1 MG TABS Take 1 tablet by mouth at bedtime as needed (for sleep).   Yes [provider]  ondansetron (ZOFRAN ODT) 4 MG disintegrating tablet Take 1 tablet (4 mg total) by mouth every 8 (eight) hours as needed for nausea or vomiting. 01/01/16  Yes Melvenia Beam, MD  OVER THE COUNTER MEDICATION Take 1 tablet by mouth daily. Astragalus-for immune function   Yes [provider]  OVER THE COUNTER MEDICATION Take 1 tablet by mouth daily. Black current seed   Yes [provider]  venlafaxine XR (EFFEXOR-XR) 37.5 MG 24 hr capsule Take 1 capsule (37.5 mg total)  by mouth daily with breakfast. 04/21/18  Yes Melvenia Beam, MD  ofloxacin (OCUFLOX) 0.3 % ophthalmic solution 1 drop every 4 hours for the first 2 days, then 4 times a day for the next 5 days 05/08/18   Tasia Catchings, Amy V, PA-C  riboflavin (VITAMIN B-2) 100 MG TABS tablet Take 100 mg by mouth daily.    [provider]  vitamin E 400 UNIT capsule Take 400 Units by mouth daily.    [provider]    Family History Family History  Problem Relation Age of Onset  . Migraines Mother   . Migraines Brother   . Breast cancer Paternal Aunt     Social History Social History   Tobacco Use  . Smoking status: Former Smoker    Packs/day: 2.00    Years: 5.00    Pack  years: 10.00    Types: Cigarettes    Last attempt to quit: 11/16/1968    Years since quitting: 49.5  . Smokeless tobacco: Never Used  Substance Use Topics  . Alcohol use: Yes    Comment: 1 glass wine per day  . Drug use: No     Allergies   Keflex [cephalexin] and Macrobid [nitrofurantoin monohyd macro]   Review of Systems Review of Systems  Reason unable to perform ROS: See HPI as above.     Physical Exam Triage Vital Signs ED Triage Vitals  Enc Vitals White     BP 05/08/18 1526 90/65     Pulse Rate 05/08/18 1526 73     Resp 05/08/18 1526 16     Temp --      Temp src --      SpO2 05/08/18 1526 100 %     Weight --      Height --      Head Circumference --      Peak Flow --      Pain Score 05/08/18 1527 4     Pain Loc --      Pain Edu? --      Excl. in [place]? --    No data found.  Updated Vital Signs BP 90/65   Pulse 73   Resp 16   SpO2 100%   Visual Acuity Right Eye Distance: 20/20 Left Eye Distance: 20/25 Bilateral Distance: 20/20  Right Eye Near:   Left Eye Near:    Bilateral Near:     Physical Exam  Constitutional: She is oriented to person, place, and time. She appears well-developed and well-nourished. No distress.  HENT:  Head: Normocephalic and atraumatic.  Eyes: Pupils are equal, round, and reactive to light. EOM and lids are normal. Lids are everted and swept, no foreign bodies found.  Mild chemosis to lateral right eye.  Subconjunctival hemorrhage to the lateral right eye. No photophobia on exam.   IOP (Tonopen) 24, 24, 24  Fluorescein stain without uptake.  Neurological: She is alert and oriented to person, place, and time.   UC Treatments / Results  Labs (all labs ordered are listed, but only abnormal results are displayed) Labs Reviewed - No data to display  EKG None  Radiology No results found.  Procedures Procedures (including critical care time)  Medications Ordered in UC Medications - No data to display  Initial  Impression / Assessment and Plan / UC Course  I have reviewed the triage vital signs and the nursing notes.  Pertinent labs & imaging results that were available during my care of the patient were reviewed by  me and considered in my medical decision making (see chart for details).    No corneal abrasion on exam.  Will start patient on ofloxacin as directed. Artificial tears gel as directed. Return precautions given.  Otherwise patient to follow-up with ophthalmology within the week for reevaluation needed.  Final Clinical Impressions(s) / UC Diagnoses   Final diagnoses:  Right eye injury, initial encounter    ED Prescriptions    Medication Sig Dispense Auth. Provider   ofloxacin (OCUFLOX) 0.3 % ophthalmic solution 1 drop every 4 hours for the first 2 days, then 4 times a day for the next 5 days 5 mL Tobin Chad, [place] 05/08/18 1612

## 2018-05-16 DIAGNOSIS — S0501XA Injury of conjunctiva and corneal abrasion without foreign body, right eye, initial encounter: Secondary | ICD-10-CM | POA: Diagnosis not present

## 2018-06-03 DIAGNOSIS — N951 Menopausal and female climacteric states: Secondary | ICD-10-CM | POA: Diagnosis not present

## 2018-06-03 DIAGNOSIS — E2839 Other primary ovarian failure: Secondary | ICD-10-CM | POA: Diagnosis not present

## 2018-06-03 DIAGNOSIS — N952 Postmenopausal atrophic vaginitis: Secondary | ICD-10-CM | POA: Diagnosis not present

## 2018-06-03 DIAGNOSIS — Z113 Encounter for screening for infections with a predominantly sexual mode of transmission: Secondary | ICD-10-CM | POA: Diagnosis not present

## 2018-06-07 ENCOUNTER — Other Ambulatory Visit: Payer: Self-pay | Admitting: Obstetrics and Gynecology

## 2018-06-07 DIAGNOSIS — E2839 Other primary ovarian failure: Secondary | ICD-10-CM

## 2018-06-15 ENCOUNTER — Encounter: Payer: Self-pay | Admitting: Neurology

## 2018-06-15 ENCOUNTER — Ambulatory Visit (INDEPENDENT_AMBULATORY_CARE_PROVIDER_SITE_OTHER): Payer: Medicare Other | Admitting: Neurology

## 2018-06-15 VITALS — BP 110/72 | HR 79 | Ht 63.0 in | Wt 94.0 lb

## 2018-06-15 DIAGNOSIS — G43109 Migraine with aura, not intractable, without status migrainosus: Secondary | ICD-10-CM | POA: Diagnosis not present

## 2018-06-15 DIAGNOSIS — G5601 Carpal tunnel syndrome, right upper limb: Secondary | ICD-10-CM | POA: Diagnosis not present

## 2018-06-15 MED ORDER — VENLAFAXINE HCL ER 37.5 MG PO CP24: 37.5000 mg | ORAL_CAPSULE | Freq: Every day | ORAL | 5 refills | Status: AC

## 2018-06-15 NOTE — Progress Notes (Signed)
[facility]   Provider: Dr Jaynee Eagles Referring Provider: Vicie Mutters, MD Primary Care Physician: Mayra Neer, MD  CC: Vestibular migraine  Interval history:  Here for follow up of migraine but has a new problem of neck pain. Doing well  She has had infrequent migraines. When she has them she takes a few ibuprofen and works. She has had a few episodes of aura, discussed increased risk of stroke in patients with aura and needs to discuss with physician who gives her the estrogen. Dehydration is a trigger. Effexor is working. She is aware of the risks of estrogen therapy for risk of stroke. -5 years ago her dog pulled her off the deck. She has protrusions on xray and pain on palpation of the spine. When she wakes up in the morning her whole right arm will be numb and aching. She has tried conservative measures for years, analgesics, massage.    Interval history 01/14/2017:  Once a month she has a headache and she takes a few tylenol or a few advil. It usually takes care of it. If she sleeps she feels better. Her husband died and she has been in grief therapy.She has had a few episodes of aura without headache rare dizziness. She has not needed her Frova. She is legally blind. Othrewise doing well. We discussed grief and grief therapy since the death of her husband and how it may affect migraines.   Interval History 01/01/2016:  She feels better. She still has dizziness. She still has headaches but they are different, she gets facial tingling. She has some nausea with the headaches now. 2 days prior to headache she would get some vision changes like a flash instead of the aura. She does nto want to increase the effexor. If she forgets to take it she feels very dizzy. She is having 2-3 mgraines a month. They can last 24 hours or a few days. She treats migraines with advil and takes it easy. She doesn;t like the way Frova makes her feel.   HPI: Emily White is a 67 y.o. female  here as a referral from Dr. Thornell Mule for vestibular migraines. PMHx migraine, anxiety. She has a lot of stress in her life. Her husband had lung cancer and has a daughter who is an alcoholic. She takes frova and doesn't like the way she feels. She saw Dr. Thornell Mule for dizziness. For 6-12 months she has transient dizziness, when she looks down feels like she is going to fall, feelings of nausea like she is sea sick and has to stare at something and not move. She was having ear pain and crackling in the ears, tinnitus and she went to see Dr. Thornell Mule whose evaluation was vestibular migraines. She has migraines, she had one this past weekend in the middle of the night. She will get fatigue a day or two before, muscle aches in the neck, speech doesn;t come out right, she will drop things, she will have loose bowel movements. She will have extreme acuity of vision and then the view focuses in then zig zag lines in the vision for 30 minutes and then sometimes it is followed by a headache. Headache pain will start on the left and extend on the left side of the face, tingling around the eyes and mouth, may switch sides. It can a day or a week. She has 14 headaches days a week, 4 migraines a month. She has nausea, no vomiting. Had a side effect to topamax. Aunts with PD and  Alzheimers. Older brother with migraine and mother with migraine. No other focal neurologic deficits.  Reviewed notes, labs and imaging from outside physicians, which showed:  MRi of the brain, personally reviewed images and agree with following: There is no evidence of acute infarct, intracranial hemorrhage, mass, midline shift, or extra-axial fluid collection. No significant white matter disease is seen for age. Ventricles and sulci are normal. No abnormal brain parenchymal or meningeal enhancement is identified.  Orbits are unremarkable. There is mild bilateral ethmoid air cell and left maxillary sinus mucosal thickening. No significant mastoid  effusion is seen. Major intracranial vascular flow voids are preserved.  Dedicated imaging through the internal auditory canals demonstrates a normal course of cranial nerves VII and VIII without evidence of mass or abnormal enhancement. Inner ear structures demonstrate normal signal bilaterally. No mass is seen within the cerebellopontine angles.  IMPRESSION: Unremarkable appearance of the brain and internal auditory canals.  Review of Systems: Patient complains of symptoms per HPI as well as the following symptoms: fatigue, eye pain, palpitations, wheezing, rinign in ears, spinning sensation, constipation, aching muscles, headache, dizziness, insomnia, anxiety, decreased energy. Pertinent negatives per HPI. All others negative.  Social History   Socioeconomic History  . Marital status: Widowed    Spouse name: Richard   . Number of children: 3  . Years of education: 41  . Highest education level: Not on file  Occupational History  . Not on file  Social Needs  . Financial resource strain: Not on file  . Food insecurity:    Worry: Not on file    Inability: Not on file  . Transportation needs:    Medical: Not on file    Non-medical: Not on file  Tobacco Use  . Smoking status: Former Smoker    Packs/day: 2.00    Years: 5.00    Pack years: 10.00    Types: Cigarettes    Last attempt to quit: 11/16/1968    Years since quitting: 49.6  . Smokeless tobacco: Never Used  Substance and Sexual Activity  . Alcohol use: Yes    Comment: 1 glass wine per day  . Drug use: No  . Sexual activity: Not on file  Lifestyle  . Physical activity:    Days per week: Not on file    Minutes per session: Not on file  . Stress: Not on file  Relationships  . Social connections:    Talks on phone: Not on file    Gets together: Not on file    Attends religious service: Not on file    Active member of club or organization: Not on file    Attends meetings of clubs or organizations: Not on file     Relationship status: Not on file  . Intimate partner violence:    Fear of current or ex partner: Not on file    Emotionally abused: Not on file    Physically abused: Not on file    Forced sexual activity: Not on file  Other Topics Concern  . Not on file  Social History Narrative   Lives at home. Her daughter is moving out soon.    Caffeine use: 1 cup coffee per day   Right-handed    Family History  Problem Relation Age of Onset  . Migraines Mother   . Migraines Brother   . Breast cancer Paternal Aunt   . Thyroid cancer Daughter   . Other Daughter        liver hemangioma  Past Medical History:  Diagnosis Date  . Anxiety   . Arthritis   . Asthma   . Corneal laceration of right eye   . GERD (gastroesophageal reflux disease)    asymptomatic  . Headache(784.0)    migraine  . IBS (irritable bowel syndrome)   . Migraine   . Nausea and vomiting    After surgery  . Weight loss     Past Surgical History:  Procedure Laterality Date  . ABDOMINAL HYSTERECTOMY  2000   complete  . APPENDECTOMY    . COLONOSCOPY WITH PROPOFOL N/A 06/28/2014   Procedure: COLONOSCOPY WITH PROPOFOL;  Surgeon: Cleotis Nipper, MD;  Location: [facility];  Service: Endoscopy;  Laterality: N/A;  . OVARIAN CYST REMOVAL Right 1983    Current Outpatient Medications  Medication Sig Dispense Refill  . albuterol (PROVENTIL HFA;VENTOLIN HFA) 108 (90 BASE) MCG/ACT inhaler Inhale 1 puff into the lungs every 6 (six) hours as needed for wheezing or shortness of breath.    . ALPRAZolam (XANAX) 0.25 MG tablet Take 0.125 mg by mouth 3 (three) times daily as needed for anxiety.     Marland Kitchen aspirin EC 81 MG tablet Take 81 mg by mouth daily.    Marland Kitchen b complex vitamins tablet Take 1 tablet by mouth daily.    Marland Kitchen BIOTIN PO Take 1 capsule by mouth daily.    . Calcium-Magnesium (CAL-MAG PO) Take 1 tablet by mouth daily. 500-250mg     . Cholecalciferol (VITAMIN D) 2000 UNITS tablet Take 2,000 Units by mouth daily.     .  Digestive Enzymes (DIGESTIVE ENZYME PO) Take 86 mg by mouth daily.    Marland Kitchen estradiol (ESTRACE) 0.5 MG tablet Take 0.5 mg by mouth daily.    . fluticasone (FLOVENT HFA) 44 MCG/ACT inhaler Inhale 2 puffs into the lungs every morning.    . Melatonin 1 MG TABS Take 1 tablet by mouth at bedtime as needed (for sleep).    Marland Kitchen OVER THE COUNTER MEDICATION Take 1 tablet by mouth daily. Astragalus-for immune function    . OVER THE COUNTER MEDICATION Take 1 tablet by mouth daily. Black current seed    . OVER THE COUNTER MEDICATION Take by mouth. Metagenix Probiotic    . OVER THE COUNTER MEDICATION Ashwaganda- immune system supplement    . riboflavin (VITAMIN B-2) 100 MG TABS tablet Take 100 mg by mouth daily.    Marland Kitchen venlafaxine XR (EFFEXOR-XR) 37.5 MG 24 hr capsule Take 1 capsule (37.5 mg total) by mouth daily with breakfast. 90 capsule 0  . ondansetron (ZOFRAN ODT) 4 MG disintegrating tablet Take 1 tablet (4 mg total) by mouth every 8 (eight) hours as needed for nausea or vomiting. 20 tablet 12   No current facility-administered medications for this visit.     Allergies as of 06/15/2018 - Review Complete 06/15/2018  Allergen Reaction Noted  . Keflex [cephalexin] Anaphylaxis and Nausea And Vomiting 06/28/2014  . Macrobid [nitrofurantoin monohyd macro] Nausea And Vomiting 06/28/2014    Vitals: BP 110/72 (BP Location: Right Arm, Patient Position: Sitting)   Pulse 79   Ht 5\' 3"  (1.6 m)   Wt 94 lb (42.6 kg)   BMI 16.65 kg/m  Last Weight:  Wt Readings from Last 1 Encounters:  06/15/18 94 lb (42.6 kg)   Last Height:   Ht Readings from Last 1 Encounters:  06/15/18 5\' 3"  (1.6 m)       Cranial Nerves:  The pupils are equal, round, and reactive to light. The fundi are  flat Visual fields are full to finger confrontation. Extraocular movements are intact. Trigeminal sensation is intact and the muscles of mastication are normal. The face is symmetric. The palate elevates in the midline. Hearing intact.  Voice is normal. Shoulder shrug is normal. The tongue has normal motion without fasciculations.   Coordination:  Normal finger to nose and heel to shin. Normal rapid alternating movements.   Gait:  Heel-toe and tandem gait are normal.   Motor Observation:  No asymmetry, no atrophy, and no involuntary movements noted. Tone:  Normal muscle tone.   Posture:  Posture is normal. normal erect   Strength: weakness right opponens    Sensation: intact to LT   Reflex Exam:  DTR's:  Deep tendon reflexes in the upper and lower extremities are brisk bilaterally.  Toes:  The toes are downgoing bilaterally.  Clonus:  Clonus is absent.   + phalen's sign and maneuver   Assessment/Plan: 67 year old female with migraines with aura, without status, not intractable. Dizziness and vertigo are very common in migraine disorders. MRi of the brain was unremarkable. She does not have vascular risk factors, will hold off on MRA to detect stenosis or occlusion of the posterior circulation.  Continue Venlafaxine low dose which is a migraine/headache preventative and helps with the stress she is going through as well. She can also try Riboflavin 400 mg/day (Vitamin B2) (every day), Magnesium 400-600 mg (trigmagnesium dicitrate) daily (every day).  Discussed risk of stroke in patients with migraine with aura, she is on estrogen, discussed risks vs benefits and asked her to discuss with her physician.   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 Over half this time was spent on counseling patient on the migraine, cts different diagnostic and therapeutic options available.

## 2018-06-15 NOTE — Patient Instructions (Signed)
Carpal Tunnel Syndrome Carpal tunnel syndrome is a condition that causes pain in your hand and arm. The carpal tunnel is a narrow area that is on the palm side of your wrist. Repeated wrist motion or certain diseases may cause swelling in the tunnel. This swelling can pinch the main nerve in the wrist (median nerve). Follow these instructions at home: If you have a splint:  Wear it as told by your doctor. Remove it only as told by your doctor.  Loosen the splint if your fingers: ? Become numb and tingle. ? Turn blue and cold.  Keep the splint clean and dry. General instructions  Take over-the-counter and prescription medicines only as told by your doctor.  Rest your wrist from any activity that may be causing your pain. If needed, talk to your employer about changes that can be made in your work, such as getting a wrist pad to use while typing.  If directed, apply ice to the painful area: ? Put ice in a plastic bag. ? Place a towel between your skin and the bag. ? Leave the ice on for 20 minutes, 2-3 times per day.  Keep all follow-up visits as told by your doctor. This is important.  Do any exercises as told by your doctor, physical therapist, or occupational therapist. Contact a doctor if:  You have new symptoms.  Medicine does not help your pain.  Your symptoms get worse. This information is not intended to replace advice given to you by your health care provider. Make sure you discuss any questions you have with your health care provider. Document Released: 10/22/2011 Document Revised: 04/09/2016 Document Reviewed: 03/20/2015 Elsevier Interactive Patient Education  2018 [employer]  

## 2018-06-28 DIAGNOSIS — H5213 Myopia, bilateral: Secondary | ICD-10-CM | POA: Diagnosis not present

## 2018-06-28 DIAGNOSIS — H40023 Open angle with borderline findings, high risk, bilateral: Secondary | ICD-10-CM | POA: Diagnosis not present

## 2018-07-01 DIAGNOSIS — J45909 Unspecified asthma, uncomplicated: Secondary | ICD-10-CM | POA: Diagnosis not present

## 2018-07-01 DIAGNOSIS — K589 Irritable bowel syndrome without diarrhea: Secondary | ICD-10-CM | POA: Diagnosis not present

## 2018-07-01 DIAGNOSIS — F411 Generalized anxiety disorder: Secondary | ICD-10-CM | POA: Diagnosis not present

## 2018-07-01 DIAGNOSIS — E46 Unspecified protein-calorie malnutrition: Secondary | ICD-10-CM | POA: Diagnosis not present

## 2018-07-01 DIAGNOSIS — Z23 Encounter for immunization: Secondary | ICD-10-CM | POA: Diagnosis not present

## 2018-07-01 DIAGNOSIS — Z13220 Encounter for screening for lipoid disorders: Secondary | ICD-10-CM | POA: Diagnosis not present

## 2018-07-01 DIAGNOSIS — G43909 Migraine, unspecified, not intractable, without status migrainosus: Secondary | ICD-10-CM | POA: Diagnosis not present

## 2018-07-01 DIAGNOSIS — H40009 Preglaucoma, unspecified, unspecified eye: Secondary | ICD-10-CM | POA: Diagnosis not present

## 2018-07-01 DIAGNOSIS — Z0000 Encounter for general adult medical examination without abnormal findings: Secondary | ICD-10-CM | POA: Diagnosis not present

## 2018-07-20 DIAGNOSIS — D2272 Melanocytic nevi of left lower limb, including hip: Secondary | ICD-10-CM | POA: Diagnosis not present

## 2018-07-20 DIAGNOSIS — L578 Other skin changes due to chronic exposure to nonionizing radiation: Secondary | ICD-10-CM | POA: Diagnosis not present

## 2018-07-20 DIAGNOSIS — L603 Nail dystrophy: Secondary | ICD-10-CM | POA: Diagnosis not present

## 2018-07-20 DIAGNOSIS — D229 Melanocytic nevi, unspecified: Secondary | ICD-10-CM | POA: Diagnosis not present

## 2018-07-20 DIAGNOSIS — D1801 Hemangioma of skin and subcutaneous tissue: Secondary | ICD-10-CM | POA: Diagnosis not present

## 2018-07-20 DIAGNOSIS — D225 Melanocytic nevi of trunk: Secondary | ICD-10-CM | POA: Diagnosis not present

## 2018-07-20 DIAGNOSIS — L821 Other seborrheic keratosis: Secondary | ICD-10-CM | POA: Diagnosis not present

## 2018-07-20 DIAGNOSIS — L814 Other melanin hyperpigmentation: Secondary | ICD-10-CM | POA: Diagnosis not present

## 2018-07-22 ENCOUNTER — Ambulatory Visit
Admission: RE | Admit: 2018-07-22 | Discharge: 2018-07-22 | Disposition: A | Discharge status: Home / Self Care | Payer: Medicare Other | Source: Ambulatory Visit | Attending: Obstetrics and Gynecology | Admitting: Obstetrics and Gynecology

## 2018-07-22 DIAGNOSIS — Z13820 Encounter for screening for osteoporosis: Secondary | ICD-10-CM | POA: Diagnosis not present

## 2018-07-22 DIAGNOSIS — Z780 Asymptomatic menopausal state: Secondary | ICD-10-CM | POA: Diagnosis not present

## 2018-07-22 DIAGNOSIS — E2839 Other primary ovarian failure: Secondary | ICD-10-CM

## 2018-07-28 DIAGNOSIS — H40013 Open angle with borderline findings, low risk, bilateral: Secondary | ICD-10-CM | POA: Diagnosis not present

## 2018-10-31 DIAGNOSIS — F411 Generalized anxiety disorder: Secondary | ICD-10-CM | POA: Diagnosis not present

## 2018-10-31 DIAGNOSIS — E46 Unspecified protein-calorie malnutrition: Secondary | ICD-10-CM | POA: Diagnosis not present

## 2018-10-31 DIAGNOSIS — G5601 Carpal tunnel syndrome, right upper limb: Secondary | ICD-10-CM | POA: Diagnosis not present

## 2018-10-31 DIAGNOSIS — Z23 Encounter for immunization: Secondary | ICD-10-CM | POA: Diagnosis not present

## 2018-10-31 DIAGNOSIS — F4321 Adjustment disorder with depressed mood: Secondary | ICD-10-CM | POA: Diagnosis not present

## 2018-12-07 DIAGNOSIS — G5601 Carpal tunnel syndrome, right upper limb: Secondary | ICD-10-CM | POA: Diagnosis not present

## 2018-12-07 DIAGNOSIS — M79641 Pain in right hand: Secondary | ICD-10-CM | POA: Diagnosis not present

## 2018-12-16 DIAGNOSIS — R309 Painful micturition, unspecified: Secondary | ICD-10-CM | POA: Diagnosis not present

## 2019-02-08 DIAGNOSIS — G5602 Carpal tunnel syndrome, left upper limb: Secondary | ICD-10-CM | POA: Diagnosis not present

## 2019-02-08 DIAGNOSIS — G5603 Carpal tunnel syndrome, bilateral upper limbs: Secondary | ICD-10-CM | POA: Diagnosis not present

## 2019-02-08 DIAGNOSIS — G5601 Carpal tunnel syndrome, right upper limb: Secondary | ICD-10-CM | POA: Diagnosis not present

## 2019-02-08 DIAGNOSIS — M79641 Pain in right hand: Secondary | ICD-10-CM | POA: Diagnosis not present

## 2019-02-17 DIAGNOSIS — H1033 Unspecified acute conjunctivitis, bilateral: Secondary | ICD-10-CM | POA: Diagnosis not present

## 2019-03-09 ENCOUNTER — Other Ambulatory Visit: Payer: Self-pay | Admitting: Obstetrics and Gynecology

## 2019-03-09 DIAGNOSIS — Z1231 Encounter for screening mammogram for malignant neoplasm of breast: Secondary | ICD-10-CM

## 2019-03-21 DIAGNOSIS — G5602 Carpal tunnel syndrome, left upper limb: Secondary | ICD-10-CM | POA: Diagnosis not present

## 2019-03-21 DIAGNOSIS — G5601 Carpal tunnel syndrome, right upper limb: Secondary | ICD-10-CM | POA: Diagnosis not present

## 2019-03-24 DIAGNOSIS — Z20828 Contact with and (suspected) exposure to other viral communicable diseases: Secondary | ICD-10-CM | POA: Diagnosis not present

## 2019-04-04 DIAGNOSIS — G5601 Carpal tunnel syndrome, right upper limb: Secondary | ICD-10-CM | POA: Diagnosis not present

## 2019-04-14 DIAGNOSIS — Z03818 Encounter for observation for suspected exposure to other biological agents ruled out: Secondary | ICD-10-CM | POA: Diagnosis not present

## 2019-04-18 DIAGNOSIS — M79641 Pain in right hand: Secondary | ICD-10-CM | POA: Diagnosis not present

## 2019-05-10 ENCOUNTER — Other Ambulatory Visit: Payer: Self-pay

## 2019-05-10 ENCOUNTER — Ambulatory Visit
Admission: RE | Admit: 2019-05-10 | Discharge: 2019-05-10 | Disposition: A | Discharge status: Home / Self Care | Payer: Medicare Other | Source: Ambulatory Visit | Attending: Obstetrics and Gynecology | Admitting: Obstetrics and Gynecology

## 2019-05-10 DIAGNOSIS — Z1231 Encounter for screening mammogram for malignant neoplasm of breast: Secondary | ICD-10-CM | POA: Diagnosis not present

## 2019-06-15 DIAGNOSIS — Z124 Encounter for screening for malignant neoplasm of cervix: Secondary | ICD-10-CM | POA: Diagnosis not present

## 2019-06-15 DIAGNOSIS — R87612 Low grade squamous intraepithelial lesion on cytologic smear of cervix (LGSIL): Secondary | ICD-10-CM | POA: Diagnosis not present

## 2019-06-15 DIAGNOSIS — R875 Abnormal microbiological findings in specimens from female genital organs: Secondary | ICD-10-CM | POA: Diagnosis not present

## 2019-06-28 DIAGNOSIS — E46 Unspecified protein-calorie malnutrition: Secondary | ICD-10-CM | POA: Diagnosis not present

## 2019-07-05 DIAGNOSIS — F411 Generalized anxiety disorder: Secondary | ICD-10-CM | POA: Diagnosis not present

## 2019-07-05 DIAGNOSIS — M7711 Lateral epicondylitis, right elbow: Secondary | ICD-10-CM | POA: Diagnosis not present

## 2019-07-05 DIAGNOSIS — E46 Unspecified protein-calorie malnutrition: Secondary | ICD-10-CM | POA: Diagnosis not present

## 2019-07-05 DIAGNOSIS — G43909 Migraine, unspecified, not intractable, without status migrainosus: Secondary | ICD-10-CM | POA: Diagnosis not present

## 2019-07-05 DIAGNOSIS — Z7189 Other specified counseling: Secondary | ICD-10-CM | POA: Diagnosis not present

## 2019-07-05 DIAGNOSIS — H40009 Preglaucoma, unspecified, unspecified eye: Secondary | ICD-10-CM | POA: Diagnosis not present

## 2019-07-05 DIAGNOSIS — J45909 Unspecified asthma, uncomplicated: Secondary | ICD-10-CM | POA: Diagnosis not present

## 2019-07-05 DIAGNOSIS — Z0000 Encounter for general adult medical examination without abnormal findings: Secondary | ICD-10-CM | POA: Diagnosis not present

## 2019-07-05 DIAGNOSIS — K589 Irritable bowel syndrome without diarrhea: Secondary | ICD-10-CM | POA: Diagnosis not present

## 2019-07-20 DIAGNOSIS — R87622 Low grade squamous intraepithelial lesion on cytologic smear of vagina (LGSIL): Secondary | ICD-10-CM | POA: Diagnosis not present

## 2019-07-27 DIAGNOSIS — D216 Benign neoplasm of connective and other soft tissue of trunk, unspecified: Secondary | ICD-10-CM | POA: Diagnosis not present

## 2019-07-27 DIAGNOSIS — L603 Nail dystrophy: Secondary | ICD-10-CM | POA: Diagnosis not present

## 2019-07-27 DIAGNOSIS — L719 Rosacea, unspecified: Secondary | ICD-10-CM | POA: Diagnosis not present

## 2019-07-27 DIAGNOSIS — L309 Dermatitis, unspecified: Secondary | ICD-10-CM | POA: Diagnosis not present

## 2019-07-27 DIAGNOSIS — L821 Other seborrheic keratosis: Secondary | ICD-10-CM | POA: Diagnosis not present

## 2019-07-27 DIAGNOSIS — D3617 Benign neoplasm of peripheral nerves and autonomic nervous system of trunk, unspecified: Secondary | ICD-10-CM | POA: Diagnosis not present

## 2019-07-27 DIAGNOSIS — D225 Melanocytic nevi of trunk: Secondary | ICD-10-CM | POA: Diagnosis not present

## 2019-07-27 DIAGNOSIS — L814 Other melanin hyperpigmentation: Secondary | ICD-10-CM | POA: Diagnosis not present

## 2019-07-27 DIAGNOSIS — D2272 Melanocytic nevi of left lower limb, including hip: Secondary | ICD-10-CM | POA: Diagnosis not present

## 2019-07-30 ENCOUNTER — Other Ambulatory Visit: Payer: Self-pay

## 2019-07-30 ENCOUNTER — Emergency Department (HOSPITAL_COMMUNITY)
Admission: EM | Admit: 2019-07-30 | Discharge: 2019-07-30 | Disposition: A | Discharge status: Home / Self Care | Payer: Medicare Other | Attending: Emergency Medicine | Admitting: Emergency Medicine

## 2019-07-30 ENCOUNTER — Encounter (HOSPITAL_COMMUNITY): Payer: Self-pay

## 2019-07-30 ENCOUNTER — Emergency Department (HOSPITAL_COMMUNITY): Payer: Medicare Other

## 2019-07-30 DIAGNOSIS — R0789 Other chest pain: Secondary | ICD-10-CM | POA: Diagnosis not present

## 2019-07-30 DIAGNOSIS — Z87891 Personal history of nicotine dependence: Secondary | ICD-10-CM | POA: Diagnosis not present

## 2019-07-30 DIAGNOSIS — J45909 Unspecified asthma, uncomplicated: Secondary | ICD-10-CM | POA: Insufficient documentation

## 2019-07-30 DIAGNOSIS — Y9301 Activity, walking, marching and hiking: Secondary | ICD-10-CM | POA: Diagnosis not present

## 2019-07-30 DIAGNOSIS — W19XXXA Unspecified fall, initial encounter: Secondary | ICD-10-CM

## 2019-07-30 DIAGNOSIS — Z79899 Other long term (current) drug therapy: Secondary | ICD-10-CM | POA: Diagnosis not present

## 2019-07-30 DIAGNOSIS — W109XXA Fall (on) (from) unspecified stairs and steps, initial encounter: Secondary | ICD-10-CM | POA: Diagnosis not present

## 2019-07-30 DIAGNOSIS — M546 Pain in thoracic spine: Secondary | ICD-10-CM | POA: Diagnosis not present

## 2019-07-30 DIAGNOSIS — S299XXA Unspecified injury of thorax, initial encounter: Secondary | ICD-10-CM | POA: Diagnosis not present

## 2019-07-30 DIAGNOSIS — Z7982 Long term (current) use of aspirin: Secondary | ICD-10-CM | POA: Diagnosis not present

## 2019-07-30 DIAGNOSIS — Y999 Unspecified external cause status: Secondary | ICD-10-CM | POA: Diagnosis not present

## 2019-07-30 DIAGNOSIS — S3992XA Unspecified injury of lower back, initial encounter: Secondary | ICD-10-CM | POA: Diagnosis not present

## 2019-07-30 DIAGNOSIS — Y929 Unspecified place or not applicable: Secondary | ICD-10-CM | POA: Diagnosis not present

## 2019-07-30 DIAGNOSIS — R0781 Pleurodynia: Secondary | ICD-10-CM | POA: Diagnosis not present

## 2019-07-30 MED ORDER — HYDROCODONE-ACETAMINOPHEN 5-325 MG PO TABS
1.0000 | ORAL_TABLET | Freq: Once | ORAL | Status: AC
  Administered 2019-07-30: 06:00:00 1 via ORAL
  Filled 2019-07-30: qty 1

## 2019-07-30 MED ORDER — ACETAMINOPHEN 500 MG PO TABS: 1000.0000 mg | ORAL_TABLET | Freq: Three times a day (TID) | ORAL | 0 refills | Status: AC

## 2019-07-30 MED ORDER — HYDROCODONE-ACETAMINOPHEN 5-325 MG PO TABS: 0.5000 | ORAL_TABLET | Freq: Three times a day (TID) | ORAL | 0 refills | Status: AC | PRN

## 2019-07-30 NOTE — ED Triage Notes (Signed)
Pt arrives to ED after she slipped on outdoor stairs. States she came down and hit the her thoracic spine on the edge of the stair. Pt states 6/10 pain, non relieved with ice. Pt says that she had, had 3 glasses of wine and 0.25mg  xanax.

## 2019-07-30 NOTE — ED Notes (Signed)
Patient transported to X-ray 

## 2019-07-30 NOTE — ED Notes (Signed)
ED Provider at bedside. 

## 2019-07-30 NOTE — ED Provider Notes (Signed)
[facility] EMERGENCY DEPARTMENT Provider Note  CSN: [id number] Arrival date & time: 07/30/19 0128  Chief Complaint(s) Fall  HPI Emily White is a 68 y.o. female with past medical history listed below who presents to the emergency department after mechanical fall resulting in back pain.  She reports that she slipped on a wet stairs causing her to fall backwards.  She denied any head trauma or loss of consciousness.  She is complaining of severe back pain.  Pain worse with movement and palpation.  Attempted to ice with minimal relief.  No pain radiation.  No lower extremity weakness or loss of sensation.  No bladder/bowel incontinence.  Patient also complaining of right-sided rib pain.  HPI  Past Medical History Past Medical History:  Diagnosis Date  . Anxiety   . Arthritis   . Asthma   . Corneal laceration of right eye   . GERD (gastroesophageal reflux disease)    asymptomatic  . Headache(784.0)    migraine  . IBS (irritable bowel syndrome)   . Migraine   . Nausea and vomiting    After surgery  . Weight loss    Patient Active Problem List   Diagnosis Date Noted  . Migraine with aura and without status migrainosus, not intractable 08/26/2015  . Vestibular migraine 08/26/2015   Home Medication(s) Prior to Admission medications   Medication Sig Start Date End Date Taking? Authorizing Provider  albuterol (PROVENTIL HFA;VENTOLIN HFA) 108 (90 BASE) MCG/ACT inhaler Inhale 1 puff into the lungs every 6 (six) hours as needed for wheezing or shortness of breath.   Yes [provider]  ALPRAZolam (XANAX) 0.25 MG tablet Take 0.125 mg by mouth 3 (three) times daily as needed for anxiety.    Yes [provider]  aspirin EC 81 MG tablet Take 81 mg by mouth daily.   Yes [provider]  b complex vitamins tablet Take 1 tablet by mouth daily.   Yes [provider]  BIOTIN PO Take 1 capsule by mouth daily.   Yes [provider]   Calcium-Magnesium (CAL-MAG PO) Take 1 tablet by mouth daily. 500-250mg    Yes [provider]  Cholecalciferol (VITAMIN D) 2000 UNITS tablet Take 2,000 Units by mouth daily.    Yes [provider]  Digestive Enzymes (DIGESTIVE ENZYME PO) Take 86 mg by mouth daily.   Yes [provider]  estradiol (ESTRACE) 0.5 MG tablet Take 0.5 mg by mouth daily.   Yes [provider]  fluticasone (FLOVENT HFA) 44 MCG/ACT inhaler Inhale 2 puffs into the lungs every morning.   Yes [provider]  Melatonin 1 MG TABS Take 1 tablet by mouth at bedtime as needed (for sleep).   Yes [provider]  OVER THE COUNTER MEDICATION Take 1 tablet by mouth daily. Astragalus-for immune function   Yes [provider]  OVER THE COUNTER MEDICATION Take 1 tablet by mouth daily. Black current seed   Yes [provider]  OVER THE COUNTER MEDICATION Take by mouth. Metagenix Probiotic   Yes [provider]  OVER THE COUNTER MEDICATION Ashwaganda- immune system supplement   Yes [provider]  riboflavin (VITAMIN B-2) 100 MG TABS tablet Take 100 mg by mouth daily.   Yes [provider]  venlafaxine XR (EFFEXOR-XR) 37.5 MG 24 hr capsule Take 1 capsule (37.5 mg total) by mouth daily with breakfast. 06/15/18  Yes Melvenia Beam, MD  acetaminophen (TYLENOL) 500 MG tablet Take 2 tablets (1,000 mg  total) by mouth every 8 (eight) hours for 5 days. Do not take more than 4000 mg of acetaminophen (Tylenol) in a 24-hour period. Please note that other medicines that you may be prescribed may have Tylenol as well. 07/30/19 08/04/19  Fatima Blank, MD  HYDROcodone-acetaminophen (NORCO/VICODIN) 5-325 MG tablet Take 0.5-1 tablets by mouth every 8 (eight) hours as needed for up to 3 days for severe pain (That is not improved by your scheduled acetaminophen regimen). Please do not exceed 4000 mg of acetaminophen (Tylenol) a 24-hour period. Please note  that he may be prescribed additional medicine that contains acetaminophen. 07/30/19 08/02/19  Fatima Blank, MD  ondansetron (ZOFRAN ODT) 4 MG disintegrating tablet Take 1 tablet (4 mg total) by mouth every 8 (eight) hours as needed for nausea or vomiting. Patient not taking: Reported on 07/30/2019 01/01/16   Melvenia Beam, MD                                                                                                                                    Past Surgical History Past Surgical History:  Procedure Laterality Date  . ABDOMINAL HYSTERECTOMY  2000   complete  . APPENDECTOMY    . COLONOSCOPY WITH PROPOFOL N/A 06/28/2014   Procedure: COLONOSCOPY WITH PROPOFOL;  Surgeon: Cleotis Nipper, MD;  Location: [facility] ENDOSCOPY;  Service: Endoscopy;  Laterality: N/A;  . OVARIAN CYST REMOVAL Right 1983   Family History Family History  Problem Relation Age of Onset  . Migraines Mother   . Migraines Brother   . Breast cancer Paternal Aunt   . Thyroid cancer Daughter   . Other Daughter        liver hemangioma    Social History Social History   Tobacco Use  . Smoking status: Former Smoker    Packs/day: 2.00    Years: 5.00    Pack years: 10.00    Types: Cigarettes    Quit date: 11/16/1968    Years since quitting: 50.7  . Smokeless tobacco: Never Used  Substance Use Topics  . Alcohol use: Yes    Comment: 1 glass wine per day  . Drug use: No   Allergies Keflex [cephalexin], Macrobid  [nitrofurantoin macrocrystal], and Macrobid [nitrofurantoin monohyd macro]  Review of Systems Review of Systems All other systems are reviewed and are negative for acute change except as noted in the HPI  Physical Exam Vital Signs  I have reviewed the triage vital signs BP 111/62   Pulse 65   Temp 98.6 F (37 C) (Oral)   Resp 17   SpO2 98%   Physical Exam Constitutional:      General: She is not in acute distress.    Appearance: She is well-developed. She is not diaphoretic.  HENT:      Head: Normocephalic and atraumatic.     Right Ear: External ear normal.     Left Ear: External ear normal.  Nose: Nose normal.  Eyes:     General: No scleral icterus.       Right eye: No discharge.        Left eye: No discharge.     Conjunctiva/sclera: Conjunctivae normal.     Pupils: Pupils are equal, round, and reactive to light.  Neck:     Musculoskeletal: Normal range of motion and neck supple.  Cardiovascular:     Rate and Rhythm: Normal rate and regular rhythm.     Pulses:          Radial pulses are 2+ on the right side and 2+ on the left side.       Dorsalis pedis pulses are 2+ on the right side and 2+ on the left side.     Heart sounds: Normal heart sounds. No murmur. No friction rub. No gallop.   Pulmonary:     Effort: Pulmonary effort is normal. No respiratory distress.     Breath sounds: Normal breath sounds. No stridor. No wheezing.  Chest:     Chest wall: Tenderness present.    Abdominal:     General: There is no distension.     Palpations: Abdomen is soft.     Tenderness: There is no abdominal tenderness.  Musculoskeletal:     Cervical back: She exhibits no bony tenderness.     Thoracic back: She exhibits tenderness and bony tenderness.     Lumbar back: She exhibits no bony tenderness.       Back:     Comments: Clavicles stable. Chest stable to AP/Lat compression. Pelvis stable to Lat compression. No obvious extremity deformity. No chest or abdominal wall contusion.  Skin:    General: Skin is warm and dry.     Findings: No erythema or rash.  Neurological:     Mental Status: She is alert and oriented to person, place, and time.     Comments: Moving all extremities     ED Results and Treatments Labs (all labs ordered are listed, but only abnormal results are displayed) Labs Reviewed - No data to display                                                                                                                       EKG  EKG Interpretation   Date/Time:    Ventricular Rate:    PR Interval:    QRS Duration:   QT Interval:    QTC Calculation:   R Axis:     Text Interpretation:        Radiology Dg Ribs Unilateral W/chest Right  Result Date: 07/30/2019 CLINICAL DATA:  Fall EXAM: RIGHT RIBS AND CHEST - 3+ VIEW COMPARISON:  None. FINDINGS: The right side marker is incorrectly positioned on the left side of the patient. The lungs are clear. No rib fracture. IMPRESSION: No rib fracture. Electronically Signed   By: Ulyses Jarred M.D.   On: 07/30/2019 06:53   Dg Thoracic Spine 2 View  Result Date:  07/30/2019 CLINICAL DATA:  Fall EXAM: THORACIC SPINE 2 VIEWS COMPARISON:  None. FINDINGS: There is no evidence of thoracic spine fracture. Alignment is normal. No other significant bone abnormalities are identified. IMPRESSION: Negative. Electronically Signed   By: Ulyses Jarred M.D.   On: 07/30/2019 06:47   Dg Lumbar Spine 2-3 Views  Result Date: 07/30/2019 CLINICAL DATA:  Fall EXAM: LUMBAR SPINE - 2-3 VIEW COMPARISON:  None. FINDINGS: There is no evidence of lumbar spine fracture. Alignment is normal. Intervertebral disc spaces are maintained. IMPRESSION: Negative. Electronically Signed   By: Ulyses Jarred M.D.   On: 07/30/2019 06:49    Pertinent labs & imaging results that were available during my care of the patient were reviewed by me and considered in my medical decision making (see chart for details).  Medications Ordered in ED Medications  HYDROcodone-acetaminophen (NORCO/VICODIN) 5-325 MG per tablet 1 tablet (1 tablet Oral Given 07/30/19 [id number])                                                                                                                                    Procedures Procedures  (including critical care time)  Medical Decision Making / ED Course I have reviewed the nursing notes for this encounter and the patient's prior records (if available in EHR or on provided paperwork).   JACAYLA ROUGEUX was evaluated  in Emergency Department on 07/30/2019 for the symptoms described in the history of present illness. She was evaluated in the context of the global COVID-19 pandemic, which necessitated consideration that the patient might be at risk for infection with the SARS-CoV-2 virus that causes COVID-19. Institutional protocols and algorithms that pertain to the evaluation of patients at risk for COVID-19 are in a state of rapid change based on information released by regulatory bodies including the CDC and federal and state organizations. These policies and algorithms were followed during the patient's care in the ED.  Mechanical fall resulting in thoracic back and right-sided rib pain. No other injuries noted on exam. Plain films without obvious fractures. Oral pain medicine given..  The patient appears reasonably screened and/or stabilized for discharge and I doubt any other medical condition or other [facility] requiring further screening, evaluation, or treatment in the ED at this time prior to discharge.  The patient is safe for discharge with strict return precautions.       Final Clinical Impression(s) / ED Diagnoses Final diagnoses:  Fall, initial encounter  Acute midline thoracic back pain  Right-sided chest wall pain     The patient appears reasonably screened and/or stabilized for discharge and I doubt any other medical condition or other [facility] requiring further screening, evaluation, or treatment in the ED at this time prior to discharge.  Disposition: Discharge  Condition: Good  I have discussed the results, Dx and Tx plan with the patient who expressed understanding and agree(s) with the plan. Discharge instructions discussed at great length.  The patient was given strict return precautions who verbalized understanding of the instructions. No further questions at time of discharge.    ED Discharge Orders         Ordered    acetaminophen (TYLENOL) 500 MG tablet  Every 8 hours     07/30/19  0725    HYDROcodone-acetaminophen (NORCO/VICODIN) 5-325 MG tablet  Every 8 hours PRN     07/30/19 0725          [facility] narcotic database reviewed and no active prescriptions noted.   Follow Up: Mayra Neer, MD [address] [place] [address] [phone]  Schedule an appointment as soon as possible for a visit  If symptoms do not improve or  worsen     This chart was dictated using voice recognition software.  Despite best efforts to proofread,  errors can occur which can change the documentation meaning.   Fatima Blank, MD 07/30/19 [phone]

## 2019-07-30 NOTE — ED Notes (Signed)
Pt discharge paperwork reviewed. Pt verbalized understanding of all discharge instructions. Signature pad unable to capture pt signature. Pt discharged.

## 2019-08-10 DIAGNOSIS — F4323 Adjustment disorder with mixed anxiety and depressed mood: Secondary | ICD-10-CM | POA: Diagnosis not present

## 2019-08-21 DIAGNOSIS — Z86010 Personal history of colonic polyps: Secondary | ICD-10-CM | POA: Diagnosis not present

## 2019-08-21 DIAGNOSIS — R198 Other specified symptoms and signs involving the digestive system and abdomen: Secondary | ICD-10-CM | POA: Diagnosis not present

## 2019-08-21 DIAGNOSIS — Z800 Family history of malignant neoplasm of digestive organs: Secondary | ICD-10-CM | POA: Diagnosis not present

## 2019-08-21 DIAGNOSIS — R634 Abnormal weight loss: Secondary | ICD-10-CM | POA: Diagnosis not present

## 2019-08-21 DIAGNOSIS — K580 Irritable bowel syndrome with diarrhea: Secondary | ICD-10-CM | POA: Diagnosis not present

## 2019-08-24 DIAGNOSIS — F4323 Adjustment disorder with mixed anxiety and depressed mood: Secondary | ICD-10-CM | POA: Diagnosis not present

## 2019-09-07 DIAGNOSIS — F4323 Adjustment disorder with mixed anxiety and depressed mood: Secondary | ICD-10-CM | POA: Diagnosis not present

## 2019-09-19 DIAGNOSIS — H5213 Myopia, bilateral: Secondary | ICD-10-CM | POA: Diagnosis not present

## 2019-09-19 DIAGNOSIS — H40013 Open angle with borderline findings, low risk, bilateral: Secondary | ICD-10-CM | POA: Diagnosis not present

## 2019-10-16 ENCOUNTER — Other Ambulatory Visit: Payer: Self-pay

## 2019-10-16 DIAGNOSIS — Z20822 Contact with and (suspected) exposure to covid-19: Secondary | ICD-10-CM

## 2019-10-16 DIAGNOSIS — Z20828 Contact with and (suspected) exposure to other viral communicable diseases: Secondary | ICD-10-CM | POA: Diagnosis not present

## 2019-10-17 LAB — NOVEL CORONAVIRUS, NAA: SARS-CoV-2, NAA: NOT DETECTED

## 2019-11-13 DIAGNOSIS — Z20828 Contact with and (suspected) exposure to other viral communicable diseases: Secondary | ICD-10-CM | POA: Diagnosis not present

## 2019-12-01 DIAGNOSIS — F4323 Adjustment disorder with mixed anxiety and depressed mood: Secondary | ICD-10-CM | POA: Diagnosis not present

## 2019-12-13 DIAGNOSIS — F4323 Adjustment disorder with mixed anxiety and depressed mood: Secondary | ICD-10-CM | POA: Diagnosis not present

## 2020-01-31 DIAGNOSIS — B373 Candidiasis of vulva and vagina: Secondary | ICD-10-CM | POA: Diagnosis not present

## 2020-01-31 DIAGNOSIS — N760 Acute vaginitis: Secondary | ICD-10-CM | POA: Diagnosis not present

## 2020-01-31 DIAGNOSIS — R87610 Atypical squamous cells of undetermined significance on cytologic smear of cervix (ASC-US): Secondary | ICD-10-CM | POA: Diagnosis not present

## 2020-01-31 DIAGNOSIS — R87622 Low grade squamous intraepithelial lesion on cytologic smear of vagina (LGSIL): Secondary | ICD-10-CM | POA: Diagnosis not present

## 2020-02-01 DIAGNOSIS — F4323 Adjustment disorder with mixed anxiety and depressed mood: Secondary | ICD-10-CM | POA: Diagnosis not present

## 2020-03-05 ENCOUNTER — Other Ambulatory Visit: Payer: Self-pay | Admitting: Obstetrics and Gynecology

## 2020-03-05 DIAGNOSIS — Z1231 Encounter for screening mammogram for malignant neoplasm of breast: Secondary | ICD-10-CM

## 2020-03-14 DIAGNOSIS — F4323 Adjustment disorder with mixed anxiety and depressed mood: Secondary | ICD-10-CM | POA: Diagnosis not present

## 2020-03-25 DIAGNOSIS — H10411 Chronic giant papillary conjunctivitis, right eye: Secondary | ICD-10-CM | POA: Diagnosis not present

## 2020-03-25 DIAGNOSIS — H00015 Hordeolum externum left lower eyelid: Secondary | ICD-10-CM | POA: Diagnosis not present

## 2020-03-29 DIAGNOSIS — F4323 Adjustment disorder with mixed anxiety and depressed mood: Secondary | ICD-10-CM | POA: Diagnosis not present

## 2020-04-30 DIAGNOSIS — H0015 Chalazion left lower eyelid: Secondary | ICD-10-CM | POA: Diagnosis not present

## 2020-04-30 DIAGNOSIS — H0011 Chalazion right upper eyelid: Secondary | ICD-10-CM | POA: Diagnosis not present

## 2020-05-01 DIAGNOSIS — F4323 Adjustment disorder with mixed anxiety and depressed mood: Secondary | ICD-10-CM | POA: Diagnosis not present

## 2020-05-10 ENCOUNTER — Ambulatory Visit
Admission: RE | Admit: 2020-05-10 | Discharge: 2020-05-10 | Disposition: A | Discharge status: Home / Self Care | Payer: Medicare Other | Source: Ambulatory Visit | Attending: Obstetrics and Gynecology | Admitting: Obstetrics and Gynecology

## 2020-05-10 ENCOUNTER — Other Ambulatory Visit: Payer: Self-pay

## 2020-05-10 DIAGNOSIS — Z1231 Encounter for screening mammogram for malignant neoplasm of breast: Secondary | ICD-10-CM

## 2020-05-15 ENCOUNTER — Other Ambulatory Visit: Payer: Self-pay | Admitting: Obstetrics and Gynecology

## 2020-05-15 DIAGNOSIS — R928 Other abnormal and inconclusive findings on diagnostic imaging of breast: Secondary | ICD-10-CM

## 2020-05-17 ENCOUNTER — Ambulatory Visit
Admission: RE | Admit: 2020-05-17 | Discharge: 2020-05-17 | Disposition: A | Discharge status: Home / Self Care | Payer: Medicare Other | Source: Ambulatory Visit | Attending: Obstetrics and Gynecology | Admitting: Obstetrics and Gynecology

## 2020-05-17 ENCOUNTER — Other Ambulatory Visit: Payer: Self-pay

## 2020-05-17 ENCOUNTER — Ambulatory Visit: Payer: Medicare Other

## 2020-05-17 DIAGNOSIS — R928 Other abnormal and inconclusive findings on diagnostic imaging of breast: Secondary | ICD-10-CM

## 2020-06-11 DIAGNOSIS — Z20822 Contact with and (suspected) exposure to covid-19: Secondary | ICD-10-CM | POA: Diagnosis not present

## 2020-07-10 DIAGNOSIS — F411 Generalized anxiety disorder: Secondary | ICD-10-CM | POA: Diagnosis not present

## 2020-07-10 DIAGNOSIS — E46 Unspecified protein-calorie malnutrition: Secondary | ICD-10-CM | POA: Diagnosis not present

## 2020-07-10 DIAGNOSIS — K589 Irritable bowel syndrome without diarrhea: Secondary | ICD-10-CM | POA: Diagnosis not present

## 2020-07-10 DIAGNOSIS — Z79899 Other long term (current) drug therapy: Secondary | ICD-10-CM | POA: Diagnosis not present

## 2020-07-10 DIAGNOSIS — G43909 Migraine, unspecified, not intractable, without status migrainosus: Secondary | ICD-10-CM | POA: Diagnosis not present

## 2020-07-10 DIAGNOSIS — J45909 Unspecified asthma, uncomplicated: Secondary | ICD-10-CM | POA: Diagnosis not present

## 2020-07-10 DIAGNOSIS — Z0000 Encounter for general adult medical examination without abnormal findings: Secondary | ICD-10-CM | POA: Diagnosis not present

## 2020-07-10 DIAGNOSIS — H40009 Preglaucoma, unspecified, unspecified eye: Secondary | ICD-10-CM | POA: Diagnosis not present

## 2020-07-31 DIAGNOSIS — Z20822 Contact with and (suspected) exposure to covid-19: Secondary | ICD-10-CM | POA: Diagnosis not present

## 2020-08-06 DIAGNOSIS — L723 Sebaceous cyst: Secondary | ICD-10-CM | POA: Diagnosis not present

## 2020-08-06 DIAGNOSIS — L578 Other skin changes due to chronic exposure to nonionizing radiation: Secondary | ICD-10-CM | POA: Diagnosis not present

## 2020-08-06 DIAGNOSIS — L814 Other melanin hyperpigmentation: Secondary | ICD-10-CM | POA: Diagnosis not present

## 2020-08-06 DIAGNOSIS — L603 Nail dystrophy: Secondary | ICD-10-CM | POA: Diagnosis not present

## 2020-08-06 DIAGNOSIS — D225 Melanocytic nevi of trunk: Secondary | ICD-10-CM | POA: Diagnosis not present

## 2020-08-06 DIAGNOSIS — L821 Other seborrheic keratosis: Secondary | ICD-10-CM | POA: Diagnosis not present

## 2020-08-06 DIAGNOSIS — D2272 Melanocytic nevi of left lower limb, including hip: Secondary | ICD-10-CM | POA: Diagnosis not present

## 2020-09-05 DIAGNOSIS — R87622 Low grade squamous intraepithelial lesion on cytologic smear of vagina (LGSIL): Secondary | ICD-10-CM | POA: Diagnosis not present

## 2020-09-05 DIAGNOSIS — Z113 Encounter for screening for infections with a predominantly sexual mode of transmission: Secondary | ICD-10-CM | POA: Diagnosis not present

## 2020-09-05 DIAGNOSIS — N951 Menopausal and female climacteric states: Secondary | ICD-10-CM | POA: Diagnosis not present

## 2020-11-14 DIAGNOSIS — Z20822 Contact with and (suspected) exposure to covid-19: Secondary | ICD-10-CM | POA: Diagnosis not present

## 2020-11-20 DIAGNOSIS — Z20822 Contact with and (suspected) exposure to covid-19: Secondary | ICD-10-CM | POA: Diagnosis not present

## 2020-11-22 ENCOUNTER — Other Ambulatory Visit: Payer: Self-pay | Admitting: Physician Assistant

## 2020-11-22 DIAGNOSIS — R519 Headache, unspecified: Secondary | ICD-10-CM | POA: Diagnosis not present

## 2020-11-22 DIAGNOSIS — U071 COVID-19: Secondary | ICD-10-CM | POA: Diagnosis not present

## 2020-11-22 DIAGNOSIS — J45909 Unspecified asthma, uncomplicated: Secondary | ICD-10-CM | POA: Diagnosis not present

## 2020-11-22 DIAGNOSIS — R0789 Other chest pain: Secondary | ICD-10-CM | POA: Diagnosis not present

## 2021-01-02 DIAGNOSIS — F341 Dysthymic disorder: Secondary | ICD-10-CM | POA: Diagnosis not present

## 2021-02-10 DIAGNOSIS — F4320 Adjustment disorder, unspecified: Secondary | ICD-10-CM | POA: Diagnosis not present

## 2021-06-30 ENCOUNTER — Other Ambulatory Visit: Payer: Self-pay | Admitting: Obstetrics and Gynecology

## 2021-06-30 DIAGNOSIS — Z1231 Encounter for screening mammogram for malignant neoplasm of breast: Secondary | ICD-10-CM

## 2021-07-15 DIAGNOSIS — Z86010 Personal history of colonic polyps: Secondary | ICD-10-CM | POA: Diagnosis not present

## 2021-07-15 DIAGNOSIS — K5900 Constipation, unspecified: Secondary | ICD-10-CM | POA: Diagnosis not present

## 2021-07-16 ENCOUNTER — Other Ambulatory Visit: Payer: Self-pay | Admitting: Family Medicine

## 2021-07-16 ENCOUNTER — Ambulatory Visit
Admission: RE | Admit: 2021-07-16 | Discharge: 2021-07-16 | Disposition: A | Discharge status: Home / Self Care | Payer: Medicare Other | Source: Ambulatory Visit | Attending: Family Medicine | Admitting: Family Medicine

## 2021-07-16 DIAGNOSIS — M542 Cervicalgia: Secondary | ICD-10-CM | POA: Diagnosis not present

## 2021-07-16 DIAGNOSIS — H40009 Preglaucoma, unspecified, unspecified eye: Secondary | ICD-10-CM | POA: Diagnosis not present

## 2021-07-16 DIAGNOSIS — M79601 Pain in right arm: Secondary | ICD-10-CM

## 2021-07-16 DIAGNOSIS — K589 Irritable bowel syndrome without diarrhea: Secondary | ICD-10-CM | POA: Diagnosis not present

## 2021-07-16 DIAGNOSIS — Z0000 Encounter for general adult medical examination without abnormal findings: Secondary | ICD-10-CM | POA: Diagnosis not present

## 2021-07-16 DIAGNOSIS — Z1211 Encounter for screening for malignant neoplasm of colon: Secondary | ICD-10-CM | POA: Diagnosis not present

## 2021-07-16 DIAGNOSIS — F411 Generalized anxiety disorder: Secondary | ICD-10-CM | POA: Diagnosis not present

## 2021-07-16 DIAGNOSIS — J45909 Unspecified asthma, uncomplicated: Secondary | ICD-10-CM | POA: Diagnosis not present

## 2021-07-16 DIAGNOSIS — Z23 Encounter for immunization: Secondary | ICD-10-CM | POA: Diagnosis not present

## 2021-07-16 DIAGNOSIS — M792 Neuralgia and neuritis, unspecified: Secondary | ICD-10-CM | POA: Diagnosis not present

## 2021-07-16 DIAGNOSIS — E46 Unspecified protein-calorie malnutrition: Secondary | ICD-10-CM | POA: Diagnosis not present

## 2021-07-16 DIAGNOSIS — Z79899 Other long term (current) drug therapy: Secondary | ICD-10-CM | POA: Diagnosis not present

## 2021-07-16 DIAGNOSIS — L0292 Furuncle, unspecified: Secondary | ICD-10-CM | POA: Diagnosis not present

## 2021-07-16 DIAGNOSIS — G43909 Migraine, unspecified, not intractable, without status migrainosus: Secondary | ICD-10-CM | POA: Diagnosis not present

## 2021-08-08 DIAGNOSIS — D128 Benign neoplasm of rectum: Secondary | ICD-10-CM | POA: Diagnosis not present

## 2021-08-08 DIAGNOSIS — Z1211 Encounter for screening for malignant neoplasm of colon: Secondary | ICD-10-CM | POA: Diagnosis not present

## 2021-08-08 DIAGNOSIS — D122 Benign neoplasm of ascending colon: Secondary | ICD-10-CM | POA: Diagnosis not present

## 2021-08-08 DIAGNOSIS — Z800 Family history of malignant neoplasm of digestive organs: Secondary | ICD-10-CM | POA: Diagnosis not present

## 2021-08-11 DIAGNOSIS — L729 Follicular cyst of the skin and subcutaneous tissue, unspecified: Secondary | ICD-10-CM | POA: Diagnosis not present

## 2021-08-11 DIAGNOSIS — L578 Other skin changes due to chronic exposure to nonionizing radiation: Secondary | ICD-10-CM | POA: Diagnosis not present

## 2021-08-11 DIAGNOSIS — L820 Inflamed seborrheic keratosis: Secondary | ICD-10-CM | POA: Diagnosis not present

## 2021-08-11 DIAGNOSIS — L821 Other seborrheic keratosis: Secondary | ICD-10-CM | POA: Diagnosis not present

## 2021-08-11 DIAGNOSIS — D2272 Melanocytic nevi of left lower limb, including hip: Secondary | ICD-10-CM | POA: Diagnosis not present

## 2021-08-11 DIAGNOSIS — L814 Other melanin hyperpigmentation: Secondary | ICD-10-CM | POA: Diagnosis not present

## 2021-08-11 DIAGNOSIS — L719 Rosacea, unspecified: Secondary | ICD-10-CM | POA: Diagnosis not present

## 2021-08-11 DIAGNOSIS — D225 Melanocytic nevi of trunk: Secondary | ICD-10-CM | POA: Diagnosis not present

## 2021-08-12 DIAGNOSIS — D128 Benign neoplasm of rectum: Secondary | ICD-10-CM | POA: Diagnosis not present

## 2021-08-12 DIAGNOSIS — D122 Benign neoplasm of ascending colon: Secondary | ICD-10-CM | POA: Diagnosis not present

## 2021-08-25 ENCOUNTER — Ambulatory Visit
Admission: RE | Admit: 2021-08-25 | Discharge: 2021-08-25 | Disposition: A | Discharge status: Home / Self Care | Payer: Medicare Other | Source: Ambulatory Visit | Attending: Obstetrics and Gynecology | Admitting: Obstetrics and Gynecology

## 2021-08-25 ENCOUNTER — Other Ambulatory Visit: Payer: Self-pay

## 2021-08-25 DIAGNOSIS — Z1231 Encounter for screening mammogram for malignant neoplasm of breast: Secondary | ICD-10-CM | POA: Diagnosis not present

## 2021-10-02 DIAGNOSIS — H40023 Open angle with borderline findings, high risk, bilateral: Secondary | ICD-10-CM | POA: Diagnosis not present

## 2021-10-02 DIAGNOSIS — H5213 Myopia, bilateral: Secondary | ICD-10-CM | POA: Diagnosis not present

## 2021-10-08 ENCOUNTER — Emergency Department (HOSPITAL_COMMUNITY): Payer: Medicare Other

## 2021-10-08 ENCOUNTER — Other Ambulatory Visit: Payer: Self-pay

## 2021-10-08 ENCOUNTER — Observation Stay (HOSPITAL_COMMUNITY)
Admission: EM | Admit: 2021-10-08 | Discharge: 2021-10-09 | Disposition: A | Discharge status: Home / Self Care | Payer: Medicare Other | Attending: Internal Medicine | Admitting: Internal Medicine

## 2021-10-08 ENCOUNTER — Encounter (HOSPITAL_COMMUNITY): Payer: Self-pay | Admitting: Emergency Medicine

## 2021-10-08 DIAGNOSIS — R9431 Abnormal electrocardiogram [ECG] [EKG]: Secondary | ICD-10-CM | POA: Diagnosis not present

## 2021-10-08 DIAGNOSIS — J4530 Mild persistent asthma, uncomplicated: Secondary | ICD-10-CM | POA: Diagnosis not present

## 2021-10-08 DIAGNOSIS — Z7982 Long term (current) use of aspirin: Secondary | ICD-10-CM | POA: Insufficient documentation

## 2021-10-08 DIAGNOSIS — Z20822 Contact with and (suspected) exposure to covid-19: Secondary | ICD-10-CM | POA: Diagnosis not present

## 2021-10-08 DIAGNOSIS — F411 Generalized anxiety disorder: Secondary | ICD-10-CM | POA: Diagnosis not present

## 2021-10-08 DIAGNOSIS — R079 Chest pain, unspecified: Secondary | ICD-10-CM

## 2021-10-08 DIAGNOSIS — Z79899 Other long term (current) drug therapy: Secondary | ICD-10-CM | POA: Insufficient documentation

## 2021-10-08 DIAGNOSIS — Z87891 Personal history of nicotine dependence: Secondary | ICD-10-CM | POA: Diagnosis not present

## 2021-10-08 DIAGNOSIS — I2699 Other pulmonary embolism without acute cor pulmonale: Principal | ICD-10-CM | POA: Insufficient documentation

## 2021-10-08 DIAGNOSIS — J45909 Unspecified asthma, uncomplicated: Secondary | ICD-10-CM | POA: Diagnosis not present

## 2021-10-08 DIAGNOSIS — R072 Precordial pain: Secondary | ICD-10-CM | POA: Diagnosis present

## 2021-10-08 DIAGNOSIS — J9811 Atelectasis: Secondary | ICD-10-CM | POA: Diagnosis not present

## 2021-10-08 DIAGNOSIS — R0789 Other chest pain: Secondary | ICD-10-CM

## 2021-10-08 LAB — CBC
HCT: 41.6 % (ref 36.0–46.0)
Hemoglobin: 13.5 g/dL (ref 12.0–15.0)
MCH: 29.7 pg (ref 26.0–34.0)
MCHC: 32.5 g/dL (ref 30.0–36.0)
MCV: 91.4 fL (ref 80.0–100.0)
Platelets: 277 10*3/uL (ref 150–400)
RBC: 4.55 MIL/uL (ref 3.87–5.11)
RDW: 12.3 % (ref 11.5–15.5)
WBC: 7.2 10*3/uL (ref 4.0–10.5)
nRBC: 0 % (ref 0.0–0.2)

## 2021-10-08 LAB — BASIC METABOLIC PANEL
Anion gap: 9 (ref 5–15)
BUN: 7 mg/dL — ABNORMAL LOW (ref 8–23)
CO2: 28 mmol/L (ref 22–32)
Calcium: 9.6 mg/dL (ref 8.9–10.3)
Chloride: 102 mmol/L (ref 98–111)
Creatinine, Ser: 0.69 mg/dL (ref 0.44–1.00)
GFR, Estimated: >60 mL/min (ref >60)
Glucose, Bld: 127 mg/dL — ABNORMAL HIGH (ref 70–99)
Potassium: 3.5 mmol/L (ref 3.5–5.1)
Sodium: 139 mmol/L (ref 135–145)

## 2021-10-08 LAB — TROPONIN I (HIGH SENSITIVITY)
Troponin I (High Sensitivity): 3 ng/L (ref <18)
Troponin I (High Sensitivity): 2 ng/L (ref <18)

## 2021-10-08 MED ORDER — NITROGLYCERIN 0.4 MG SL SUBL: 0.4000 mg | SUBLINGUAL_TABLET | SUBLINGUAL | Status: DC | PRN

## 2021-10-08 MED ORDER — IOHEXOL 350 MG/ML SOLN
60.0000 mL | Freq: Once | INTRAVENOUS | Status: AC | PRN
  Administered 2021-10-08: 60 mL via INTRAVENOUS

## 2021-10-08 MED ORDER — ACETAMINOPHEN 325 MG PO TABS: 650.0000 mg | ORAL_TABLET | Freq: Four times a day (QID) | ORAL | Status: DC | PRN

## 2021-10-08 MED ORDER — ACETAMINOPHEN 650 MG RE SUPP: 650.0000 mg | Freq: Four times a day (QID) | RECTAL | Status: DC | PRN

## 2021-10-08 NOTE — ED Notes (Signed)
Pt returned from CT and is in NAD

## 2021-10-08 NOTE — ED Notes (Signed)
Pt stated that she was sent by urgent care for abnormal EKG and abnormal labs. Pt stated she was told to come by ambulance but pt decided to come POV.

## 2021-10-08 NOTE — ED Provider Notes (Signed)
Call received from radiologist, CT angiogram positive for small subsegmental embolism.  She is started on intravenous heparin.   Delora Fuel, MD 11/55/20 [phone]

## 2021-10-08 NOTE — ED Provider Notes (Signed)
Emergency Medicine Provider Triage Evaluation Note  Emily White , a 70 y.o. female  was evaluated in triage.  Pt complains of chest pain, lightheaded,.  Early fam hx of mi  Review of Systems  Positive: Chest pain, lightheaded Negative: Nausea, vomiting  Physical Exam  BP 136/83 (BP Location: Left Arm)   Pulse 67   Temp 97.8 F (36.6 C) (Oral)   Resp 18   SpO2 100%  Gen:   Awake, no distress   Resp:  Normal effort  MSK:   Moves extremities without difficulty  Other:    Medical Decision Making  Medically screening exam initiated at 4:27 PM.  Appropriate orders placed.  Emily White was informed that the remainder of the evaluation will be completed by another provider, this initial triage assessment does not replace that evaluation, and the importance of remaining in the ED until their evaluation is complete.    Rodney Booze, PA-C 10/08/21 1628    Lorelle Gibbs, DO 10/09/21 2258

## 2021-10-08 NOTE — ED Provider Notes (Signed)
[facility] EMERGENCY DEPARTMENT Provider Note   CSN: [id number] Arrival date & time: 10/08/21  1531     History Chief Complaint  Patient presents with   Chest Pain    Emily White is a 70 y.o. female.  The history is provided by the patient.  Chest Pain Pain location:  Substernal area Pain quality: pressure   Pain radiates to:  Does not radiate Pain severity:  Severe Onset quality:  Sudden Duration:  5 minutes Progression:  Resolved Chronicity:  New Context: not at rest   Relieved by:  Nothing Worsened by:  Nothing Associated symptoms: no abdominal pain, no back pain, no cough, no fever, no palpitations, no shortness of breath and no vomiting   Risk factors: prior DVT/PE (possibly? on estrogen)   Risk factors: no coronary artery disease, no diabetes mellitus, no high cholesterol and no hypertension   Risk factors comment:  Family hx of heart disease in family at <55 yr of age     Past Medical History:  Diagnosis Date   Anxiety    Arthritis    Asthma    Corneal laceration of right eye    GERD (gastroesophageal reflux disease)    asymptomatic   Headache(784.0)    migraine   IBS (irritable bowel syndrome)    Migraine    Nausea and vomiting    After surgery   Weight loss     Patient Active Problem List   Diagnosis Date Noted   Migraine with aura and without status migrainosus, not intractable 08/26/2015   Vestibular migraine 08/26/2015    Past Surgical History:  Procedure Laterality Date   ABDOMINAL HYSTERECTOMY  2000   complete   APPENDECTOMY     COLONOSCOPY WITH PROPOFOL N/A 06/28/2014   Procedure: COLONOSCOPY WITH PROPOFOL;  Surgeon: Cleotis Nipper, MD;  Location: [facility];  Service: Endoscopy;  Laterality: N/A;   OVARIAN CYST REMOVAL Right 1983     OB History   No obstetric history on file.     Family History  Problem Relation Age of Onset   Migraines Mother    Migraines Brother    Breast cancer Paternal Aunt     Thyroid cancer Daughter    Other Daughter        liver hemangioma    Social History   Tobacco Use   Smoking status: Former    Packs/day: 2.00    Years: 5.00    Pack years: 10.00    Types: Cigarettes    Quit date: 11/16/1968    Years since quitting: 52.9   Smokeless tobacco: Never  Vaping Use   Vaping Use: Never used  Substance Use Topics   Alcohol use: Yes    Comment: 1 glass wine per day   Drug use: No    Home Medications Prior to Admission medications   Medication Sig Start Date End Date Taking? Authorizing Provider  albuterol (PROVENTIL HFA;VENTOLIN HFA) 108 (90 BASE) MCG/ACT inhaler Inhale 1 puff into the lungs every 6 (six) hours as needed for wheezing or shortness of breath.    [provider]  ALPRAZolam Duanne Moron) 0.25 MG tablet Take 0.125 mg by mouth 3 (three) times daily as needed for anxiety.     [provider]  aspirin EC 81 MG tablet Take 81 mg by mouth daily.    [provider]  b complex vitamins tablet Take 1 tablet by mouth daily.    [provider]  BIOTIN PO Take 1 capsule  by mouth daily.    [provider]  Calcium-Magnesium (CAL-MAG PO) Take 1 tablet by mouth daily. 500-250mg     [provider]  Cholecalciferol (VITAMIN D) 2000 UNITS tablet Take 2,000 Units by mouth daily.     [provider]  Digestive Enzymes (DIGESTIVE ENZYME PO) Take 86 mg by mouth daily.    [provider]  estradiol (ESTRACE) 0.5 MG tablet Take 0.5 mg by mouth daily.    [provider]  fluticasone (FLOVENT HFA) 44 MCG/ACT inhaler Inhale 2 puffs into the lungs every morning.    [provider]  Melatonin 1 MG TABS Take 1 tablet by mouth at bedtime as needed (for sleep).    [provider]  ondansetron (ZOFRAN ODT) 4 MG disintegrating tablet Take 1 tablet (4 mg total) by mouth every 8 (eight) hours as needed for nausea or vomiting. Patient not taking: Reported on 07/30/2019 01/01/16   Melvenia Beam, MD  OVER THE COUNTER MEDICATION Take 1 tablet by mouth daily. Astragalus-for immune function    [provider]  OVER THE COUNTER MEDICATION Take 1 tablet by mouth daily. Black current seed    [provider]  OVER THE COUNTER MEDICATION Take by mouth. Metagenix Probiotic    [provider]  OVER THE COUNTER MEDICATION Ashwaganda- immune system supplement    [provider]  riboflavin (VITAMIN B-2) 100 MG TABS tablet Take 100 mg by mouth daily.    [provider]  venlafaxine XR (EFFEXOR-XR) 37.5 MG 24 hr capsule Take 1 capsule (37.5 mg total) by mouth daily with breakfast. 06/15/18   Melvenia Beam, MD    Allergies    Keflex [cephalexin], Macrobid  [nitrofurantoin macrocrystal], and Macrobid [nitrofurantoin monohyd macro]  Review of Systems   Review of Systems  Constitutional:  Negative for chills and fever.  HENT:  Negative for ear pain and sore throat.   Eyes:  Negative for pain and visual disturbance.  Respiratory:  Negative for cough and shortness of breath.   Cardiovascular:  Positive for chest pain. Negative for palpitations.  Gastrointestinal:  Negative for abdominal pain and vomiting.  Genitourinary:  Negative for dysuria and hematuria.  Musculoskeletal:  Negative for arthralgias and back pain.  Skin:  Negative for color change and rash.  Neurological:  Negative for seizures and syncope.  All other systems reviewed and are negative.  Physical Exam Updated Vital Signs BP 130/82   Pulse 68   Temp 98 F (36.7 C)   Resp 13   Ht 5\' 3"  (1.6 m)   Wt 45.4 kg   SpO2 98%   BMI 17.71 kg/m   Physical Exam Vitals and nursing note reviewed.  Constitutional:      General: She is not in acute distress.    Appearance: She is well-developed. She is not ill-appearing.  HENT:     Head: Normocephalic and atraumatic.  Eyes:     Extraocular Movements: Extraocular movements intact.     Conjunctiva/sclera: Conjunctivae normal.      Pupils: Pupils are equal, round, and reactive to light.  Cardiovascular:     Rate and Rhythm: Normal rate and regular rhythm.     Pulses:          Radial pulses are 2+ on the right side and 2+ on the left side.     Heart sounds: Normal heart sounds. No murmur heard. Pulmonary:     Effort: Pulmonary effort is normal. No respiratory distress.  Breath sounds: Normal breath sounds. No decreased breath sounds or wheezing.  Abdominal:     Palpations: Abdomen is soft.     Tenderness: There is no abdominal tenderness.  Musculoskeletal:        General: No swelling.     Cervical back: Normal range of motion and neck supple.  Skin:    General: Skin is warm and dry.     Capillary Refill: Capillary refill takes less than 2 seconds.  Neurological:     General: No focal deficit present.     Mental Status: She is alert.  Psychiatric:        Mood and Affect: Mood normal.    ED Results / Procedures / Treatments   Labs (all labs ordered are listed, but only abnormal results are displayed) Labs Reviewed  BASIC METABOLIC PANEL - Abnormal; Notable for the following components:      Result Value   Glucose, Bld 127 (*)    BUN 7 (*)    All other components within normal limits  RESP PANEL BY RT-PCR (FLU A&B, COVID) ARPGX2  CBC  TROPONIN I (HIGH SENSITIVITY)  TROPONIN I (HIGH SENSITIVITY)    EKG EKG Interpretation  Date/Time:  Wednesday October 08 2021 16:11:28 EST Ventricular Rate:  72 PR Interval:  136 QRS Duration: 80 QT Interval:  380 QTC Calculation: 416 R Axis:   79 Text Interpretation: Normal sinus rhythm Nonspecific ST abnormality Abnormal ECG Confirmed by Lennice Sites ([id number]) on 10/08/2021 9:51:11 PM  Radiology DG Chest 2 View  Result Date: 10/08/2021 CLINICAL DATA:  Chest pain.  Ex-smoker. EXAM: CHEST - 2 VIEW COMPARISON:  07/30/2019 FINDINGS: Midline trachea. Normal heart size and mediastinal contours. No pleural effusion or pneumothorax. Diffuse peribronchial  thickening. Clear lungs. IMPRESSION: 1. No acute cardiopulmonary disease. 2. Mild peribronchial thickening which may relate to chronic bronchitis or smoking. Electronically Signed   By: Abigail Miyamoto M.D.   On: 10/08/2021 17:04    Procedures Procedures   Medications Ordered in ED Medications - No data to display  ED Course  I have reviewed the triage vital signs and the nursing notes.  Pertinent labs & imaging results that were available during my care of the patient were reviewed by me and considered in my medical decision making (see chart for details).    MDM Rules/Calculators/A&P                           TALYSSA GIBAS is here with chest pain.  Normal vitals.  No fever.  History of asthma, former smoker.  Vital signs are normal.  Strong family history of cardiac disease with brother in his 68s who died of a heart attack, mother with MI in her 63s.  Patient had severe chest pain that occurred last night while she was sleeping.  Felt like a strong squeezing pain worse than she ever had before.  Not having any active discomfort now.  No exertional discomfort.  She has some mild ST depressions inferiorly but there is no EKG to compare to.  She has had 2 troponins that are negative.  She has had a chest x-ray that shows no pneumonia or pneumothorax or pleural effusion.  She is on estrogen.  But she is not having any shortness of breath, tachycardia, hypoxia.  She questions if she has had a small blood clot in the past but she is on a blood thinners.  We will get a CT scan to  rule out PE but given EKG changes and story talked with cardiology, Dr. Alfred Levins, on the phone who did recommend medicine admission for exercise stress test.  Patient is amenable to this plan.  We will admit to medicine.  CT scan is pending.  This chart was dictated using voice recognition software.  Despite best efforts to proofread,  errors can occur which can change the documentation meaning.   Final Clinical Impression(s) /  ED Diagnoses Final diagnoses:  Chest pain, unspecified type    Rx / DC Orders ED Discharge Orders     None        Lennice Sites, DO 10/08/21 2322

## 2021-10-08 NOTE — H&P (Signed)
History and Physical    PLEASE NOTE THAT DRAGON DICTATION SOFTWARE WAS USED IN THE CONSTRUCTION OF THIS NOTE.   TAYGEN NEWSOME XTK:[id number] DOB: [birth year] DOA: 10/08/2021  PCP: Mayra Neer, MD  Patient coming from: home   I have personally briefly reviewed patient's old medical records in [place]  Chief Complaint: chest pain  HPI: Emily White is a 70 y.o. female with medical history significant for acute pulmonary embolism approximately 10 years ago, chronic oral estrogen replacement, GAD, mild persistent asthma, who is admitted to [facility] on 10/08/2021 with acute right-sided pulmonary embolism after presenting from home to [facility] ED complaining of chest pain.   The patient reports that she awoke from sleep around 0100 on 10/08/21 experiencing sharp/achy, substernal chest discomfort radiating into the right chest, worsening with deep inspiration as well as with leaning forward.  She notes that the pain was nonexertional and not reproducible with palpation over the anterior chest wall.  She is unsure as to the specific duration of this episode of chest pain, noting that she was able to fall back asleep while still experiencing the chest discomfort, before awakening several hours later with interval resolution of the chest discomfort.  She has subsequently not experienced any prolonged episodes of similar chest discomfort following this initial episode, she reports reproducibility of the quality, intensity, and location of the chest pain that she is experiencing last night with deep inspiration.  Has not taken any nitroglycerin in response to this chest discomfort.  Denies any associated shortness of breath, nausea, vomiting, diaphoresis, or palpitations.  Not associated with any subjective fever, chills, rigors, cough, hemoptysis, orthopnea, or PND.  She conveys that she is currently chest pain-free.  Denies any known history of coronary artery disease.  She notes that  her brother was diagnosed with a myocardial infarction in his 12s, and that her mother was diagnosed with coronary artery disease in her 11s.  She conveys that she is a former smoker, having completely quit smoking in the 1970s after a 10-pack-year history of cigarette smoking.  Denies any known history of underlying attention, diabetes.  She is on a daily baby aspirin at home, otherwise no additional blood thinners.  Denies any recent trauma, travel, surgery, extended periods of diminishing ambulatory activity, or any personal history of malignancy.  Denies any new peripheral swelling, erythema, or hemoptysis.  However, she does report noting bilateral calf tenderness over the last few weeks, which is new for her.  Denies any history of previous similar chest discomfort, although she notes that she was diagnosed with an acute pulmonary embolism approximately 10 years ago, and notes that she was not treated with formal anticoagulation response to this.  She does not recall if this was a subsegmental pulmonary embolism, or specifically the laterality of this prior acute pulmonary embolism.     ED Course:  Vital signs in the ED were notable for the following: Temperature max 98.0, heart rate 68-72; blood pressure 120/75 -136/83; respiratory rate 17-18, oxygen saturation 98 to 100% on room air.   Labs were notable for the following: Initial high-sensitivity troponin I found to be 3, previous value trending down 2.  CBC notable for white blood cell count 7200, hemoglobin 13.5.  COVID-19/influenza PCR checked in the ED today was found to be negative.  Imaging and additional notable ED work-up: EKG, with no prior will for point comparison shows sinus rhythm with a rate 72, normal intervals, no evidence of T wave or  ST changes, including no evidence of ST elevation.  Chest x-ray showed no evidence of acute cardiopulmonary process, including no evidence of pneumothorax.  CTA chest showed a small acute right  lower lobe pulmonary embolism with clear lung fields bilaterally, no additional evidence of acute intrathoracic process.  While in the ED, the following were administered: Heparin bolus followed by initiation of heparin drip.     Review of Systems: As per HPI otherwise 10 point review of systems negative.   Past Medical History:  Diagnosis Date   Anxiety    Arthritis    Asthma    Corneal laceration of right eye    GERD (gastroesophageal reflux disease)    asymptomatic   Headache(784.0)    migraine   IBS (irritable bowel syndrome)    Migraine    Nausea and vomiting    After surgery   Weight loss     Past Surgical History:  Procedure Laterality Date   ABDOMINAL HYSTERECTOMY  2000   complete   APPENDECTOMY     COLONOSCOPY WITH PROPOFOL N/A 06/28/2014   Procedure: COLONOSCOPY WITH PROPOFOL;  Surgeon: Cleotis Nipper, MD;  Location: [facility];  Service: Endoscopy;  Laterality: N/A;   OVARIAN CYST REMOVAL Right 1983    Social History:  reports that she quit smoking about 52 years ago. Her smoking use included cigarettes. She has a 10.00 pack-year smoking history. She has never used smokeless tobacco. She reports current alcohol use. She reports that she does not use drugs.   Allergies  Allergen Reactions   Keflex [Cephalexin] Anaphylaxis and Nausea And Vomiting    Patient states abdominal pain also   Macrobid  [Nitrofurantoin Macrocrystal] Nausea Only   Cucumber Extract     Other reaction(s): stomach upset   Other     Other reaction(s): stomach upset   Sulfamethoxazole-Trimethoprim     Other reaction(s): stomach upset   Macrobid [Nitrofurantoin Monohyd Macro] Nausea And Vomiting    Abdominal pain    Family History  Problem Relation Age of Onset   Migraines Mother    Migraines Brother    Breast cancer Paternal Aunt    Thyroid cancer Daughter    Other Daughter        liver hemangioma    Family history reviewed and not pertinent    Prior to Admission  medications   Medication Sig Start Date End Date Taking? Authorizing Provider  aspirin EC 81 MG tablet Take 81 mg by mouth daily.   Yes [provider]  albuterol (PROVENTIL HFA;VENTOLIN HFA) 108 (90 BASE) MCG/ACT inhaler Inhale 1 puff into the lungs every 6 (six) hours as needed for wheezing or shortness of breath.    [provider]  ALPRAZolam Duanne Moron) 0.25 MG tablet Take 0.125 mg by mouth at bedtime as needed for anxiety or sleep.    [provider]  b complex vitamins tablet Take 1 tablet by mouth daily.    [provider]  BIOTIN PO Take 1 capsule by mouth daily.    [provider]  Calcium-Magnesium (CAL-MAG PO) Take 1 tablet by mouth daily. 500-250mg     [provider]  Cholecalciferol (VITAMIN D) 2000 UNITS tablet Take 2,000 Units by mouth daily.     [provider]  Digestive Enzymes (DIGESTIVE ENZYME PO) Take 86 mg by mouth daily.    [provider]  estradiol (ESTRACE) 0.5 MG tablet Take 0.5 mg by mouth daily.    [provider]  fluticasone (FLOVENT HFA) 44  MCG/ACT inhaler Inhale 2 puffs into the lungs every morning.    [provider]  Melatonin 1 MG TABS Take 1 tablet by mouth at bedtime as needed (for sleep).    [provider]  ondansetron (ZOFRAN ODT) 4 MG disintegrating tablet Take 1 tablet (4 mg total) by mouth every 8 (eight) hours as needed for nausea or vomiting. Patient not taking: Reported on 07/30/2019 01/01/16   Melvenia Beam, MD  OVER THE COUNTER MEDICATION Take 1 tablet by mouth daily. Astragalus-for immune function    [provider]  OVER THE COUNTER MEDICATION Take 1 tablet by mouth daily. Black current seed    [provider]  OVER THE COUNTER MEDICATION Take by mouth. Metagenix Probiotic    [provider]  OVER THE COUNTER MEDICATION Ashwaganda- immune system supplement    [provider]  riboflavin (VITAMIN B-2) 100 MG TABS  tablet Take 100 mg by mouth daily.    [provider]  venlafaxine XR (EFFEXOR-XR) 37.5 MG 24 hr capsule Take 1 capsule (37.5 mg total) by mouth daily with breakfast. 06/15/18   Melvenia Beam, MD     Objective    Physical Exam: Vitals:   10/08/21 1634 10/08/21 2023 10/08/21 2248 10/08/21 2300  BP:  126/78 130/82 120/72  Pulse:  77 68 69  Resp:  17 13 13   Temp:  98 F (36.7 C)    TempSrc:      SpO2:  98% 98% 98%  Weight: 45.4 kg     Height: 5\' 3"  (1.6 m)       General: appears to be stated age; alert, oriented Skin: warm, dry, no rash Head:  AT/[place] Mouth:  Oral mucosa membranes appear moist, normal dentition Neck: supple; trachea midline Heart:  RRR; did not appreciate any M/R/G Lungs: CTAB, did not appreciate any wheezes, rales, or rhonchi Abdomen: + BS; soft, ND, NT Vascular: 2+ pedal pulses b/l; 2+ radial pulses b/l Extremities: no peripheral edema, no muscle wasting Neuro: strength and sensation intact in upper and lower extremities b/l    Labs on Admission: I have personally reviewed following labs and imaging studies  CBC: Recent Labs  Lab 10/08/21 1638  WBC 7.2  HGB 13.5  HCT 41.6  MCV 91.4  PLT 353   Basic Metabolic Panel: Recent Labs  Lab 10/08/21 1638  NA 139  K 3.5  CL 102  CO2 28  GLUCOSE 127*  BUN 7*  CREATININE 0.69  CALCIUM 9.6   GFR: Estimated Creatinine Clearance: 48.2 mL/min (by C-G formula based on SCr of 0.69 mg/dL). Liver Function Tests: No results for input(s): AST, ALT, ALKPHOS, BILITOT, PROT, ALBUMIN in the last 168 hours. No results for input(s): LIPASE, AMYLASE in the last 168 hours. No results for input(s): AMMONIA in the last 168 hours. Coagulation Profile: No results for input(s): INR, PROTIME in the last 168 hours. Cardiac Enzymes: No results for input(s): CKTOTAL, CKMB, CKMBINDEX, TROPONINI in the last 168 hours. BNP (last 3 results) No results for input(s): PROBNP in the last 8760 hours. HbA1C: No  results for input(s): HGBA1C in the last 72 hours. CBG: No results for input(s): GLUCAP in the last 168 hours. Lipid Profile: No results for input(s): CHOL, HDL, LDLCALC, TRIG, CHOLHDL, LDLDIRECT in the last 72 hours. Thyroid Function Tests: No results for input(s): TSH, T4TOTAL, FREET4, T3FREE, THYROIDAB in the last 72 hours. Anemia Panel: No results for input(s): VITAMINB12, FOLATE, FERRITIN, TIBC, IRON, RETICCTPCT in the last 72 hours.  Urine analysis: No results found for: COLORURINE, APPEARANCEUR, LABSPEC, [place], GLUCOSEU, HGBUR, BILIRUBINUR, KETONESUR, PROTEINUR, UROBILINOGEN, NITRITE, LEUKOCYTESUR  Radiological Exams on Admission: DG Chest 2 View  Result Date: 10/08/2021 CLINICAL DATA:  Chest pain.  Ex-smoker. EXAM: CHEST - 2 VIEW COMPARISON:  07/30/2019 FINDINGS: Midline trachea. Normal heart size and mediastinal contours. No pleural effusion or pneumothorax. Diffuse peribronchial thickening. Clear lungs. IMPRESSION: 1. No acute cardiopulmonary disease. 2. Mild peribronchial thickening which may relate to chronic bronchitis or smoking. Electronically Signed   By: Abigail Miyamoto M.D.   On: 10/08/2021 17:04     EKG: Independently reviewed, with result as described above.    Assessment/Plan   Principal Problem:   Acute pulmonary embolism (HCC) Active Problems:   Atypical chest pain   GAD (generalized anxiety disorder)   Asthma     #) Acute Pulmonary Embolism: In the context of presenting pleuritic midline chest pain radiating into the right chest, CT chest showing evidence of acute right lower lobe pulmonary embolism without clinical or radiographic evidence of associated cor pulmonale.  No evidence of hypoxia. No evidence of associated hypotension to warrant consideration for TPA administration. Per patient, this is her 2nd PE, with first occurring approximately 10 years ago after which she reports that she did not undergo course of formal anticoagulation, raising the  possibility of subsegmental distribution of this previous pulmonary embolism.  Aside from her chronic oral estrogen replacement therapy, no obvious provoking factors.  No utility in evaluating for inheritable hypercoagulability at this time as the inflammatory phase associated with an acute PE contributes to misleading data associated with this workup. Overall, will require at least 3-6 months of anticoagulation.  Aside from a daily baby aspirin , not on any blood-thinners at home. Continue Heparin drip initiated in the ED, with plan for ensuing discussions regarding indications vs risks vs benefits of the various oral anticoagulant options, including warfarin vs DOAC's for determination of specific oral anticoagulant upon which the patient will be discharged to complete the above course of anticoagulation.  Given her report of new onset bilateral calf tenderness over the preceding weeks, also check bilateral lower extremity venous Dopplers to eval for dvt.      Plan: Heparin drip overnight. Anticipate ensuing conversion to an oral anticoagulant in the morning following additional discussions with the patient regarding the risks versus benefits of the various individual oral anticoagulant options.  Monitor on telemetry. Monitor for development of hypotension. Prn supplemental O2 in order to maintain oxygen saturations greater than or equal to 92%. Prn albuterol inhaler. Prn Norco, of which the acetaminophen component may be beneficial for its anti-inflammatory effects. Incentive spirometry.  Check INR. Recheck CBC in the morning. Echo ordered for the AM.  Check bilateral lower extremity venous Dopplers.       #) Atypical Chest Pain: Sharp/achy midline chest discomfort with radiation into the right chest, worsening with deep inspiration, and nonexertional, which appears to be atypical for ACS, but rather more consistent with finding of acute right-sided pulmonary embolism.  CTA chest performed this evening,  as above. However, given the presence of multiple CAD risk factors, including family history of premature CAD, smoking history, and  no prior cardiac ischemic evaluation, will continue to trend troponin overnight for ACS rule-out, and plan to further eval via echo in the AM.  Troponin x2 have been negative, which is reassuring from an ACS standpoint given that these labs were checked greater than 12 hours following the onset of patient's chest discomfort. EKG  shows no evidence of acute ischemic changes, including no evidence of STEMI, and CXR showed no acute CP process, including no evidence of pneumothorax. Patient currently CP free.  Started on heparin drip in the ED for acute right-sided pulmonary embolism, as above.   Plan: trend serial troponin. Monitor on telemetry. PRN sublingual nitroglycerin. Prn Norco.  PRN EKG for subsequent episodes of chest pain. Check serum Mg level and repeat BMP in the morning. Repeat CBC in the AM.  Echocardiogram ordered for the morning.  Further eval/management of acute right-sided pulmonary embolism, including heparin drip.        #) Mild persistent asthma: Documented history of such, on scheduled Flovent as well as prn albuterol as an outpatient.  No evidence to suggest acute asthma exacerbation at this time.  At increased risk for acute asthma exacerbation in the setting of presenting acute pulmonary embolism.  Plan: Continue home Flovent and as needed albuterol inhaler.  Monitor continuous pulse oximetry.  Add on serum magnesium level.      #) Generalized anxiety disorder: Documented history of; on scheduled Effexor as well as prn Xanax as an outpatient.   Plan: Continue home Effexor as well as as needed Xanax.      DVT prophylaxis: Heparin drip Code Status: Full code Family Communication: none Disposition Plan: Per Rounding Team Consults called: none;  Admission status: observation; med-tele   PLEASE NOTE THAT DRAGON DICTATION SOFTWARE WAS  USED IN THE CONSTRUCTION OF THIS NOTE.   Hazel Green DO [facility]  From [place]   10/08/2021, 11:48 PM

## 2021-10-08 NOTE — ED Triage Notes (Signed)
Patient reports chest pain that woke from her sleep. Describes it as a soreness and spasm in the middle of chest. Denies any shortness of breath.

## 2021-10-08 NOTE — ED Notes (Signed)
PT to CT.

## 2021-10-08 NOTE — ED Notes (Signed)
Patient transported to CT 

## 2021-10-09 ENCOUNTER — Encounter (HOSPITAL_COMMUNITY): Payer: Self-pay | Admitting: Internal Medicine

## 2021-10-09 ENCOUNTER — Observation Stay (HOSPITAL_BASED_OUTPATIENT_CLINIC_OR_DEPARTMENT_OTHER): Payer: Medicare Other

## 2021-10-09 DIAGNOSIS — I2699 Other pulmonary embolism without acute cor pulmonale: Secondary | ICD-10-CM | POA: Diagnosis present

## 2021-10-09 DIAGNOSIS — F411 Generalized anxiety disorder: Secondary | ICD-10-CM | POA: Diagnosis present

## 2021-10-09 DIAGNOSIS — R079 Chest pain, unspecified: Secondary | ICD-10-CM | POA: Diagnosis not present

## 2021-10-09 DIAGNOSIS — J45909 Unspecified asthma, uncomplicated: Secondary | ICD-10-CM | POA: Diagnosis present

## 2021-10-09 LAB — ECHOCARDIOGRAM COMPLETE
Area-P 1/2: 4.39 cm2
Calc EF: 57.7 %
Height: 62 in
S' Lateral: 2.7 cm
Single Plane A2C EF: 60.7 %
Single Plane A4C EF: 52.9 %
Weight: 1643.2 oz

## 2021-10-09 LAB — COMPREHENSIVE METABOLIC PANEL
ALT: 18 U/L (ref 0–44)
AST: 21 U/L (ref 15–41)
Albumin: 4 g/dL (ref 3.5–5.0)
Alkaline Phosphatase: 59 U/L (ref 38–126)
Anion gap: 6 (ref 5–15)
BUN: 9 mg/dL (ref 8–23)
CO2: 28 mmol/L (ref 22–32)
Calcium: 9 mg/dL (ref 8.9–10.3)
Chloride: 103 mmol/L (ref 98–111)
Creatinine, Ser: 0.63 mg/dL (ref 0.44–1.00)
GFR, Estimated: >60 mL/min (ref >60)
Glucose, Bld: 94 mg/dL (ref 70–99)
Potassium: 3.5 mmol/L (ref 3.5–5.1)
Sodium: 137 mmol/L (ref 135–145)
Total Bilirubin: 0.6 mg/dL (ref 0.3–1.2)
Total Protein: 6.4 g/dL — ABNORMAL LOW (ref 6.5–8.1)

## 2021-10-09 LAB — HEMOGLOBIN A1C
Hgb A1c MFr Bld: 5.1 % (ref 4.8–5.6)
Mean Plasma Glucose: 99.67 mg/dL

## 2021-10-09 LAB — MAGNESIUM: Magnesium: 2 mg/dL (ref 1.7–2.4)

## 2021-10-09 LAB — CBC
HCT: 39.2 % (ref 36.0–46.0)
Hemoglobin: 13.1 g/dL (ref 12.0–15.0)
MCH: 30.2 pg (ref 26.0–34.0)
MCHC: 33.4 g/dL (ref 30.0–36.0)
MCV: 90.3 fL (ref 80.0–100.0)
Platelets: 267 10*3/uL (ref 150–400)
RBC: 4.34 MIL/uL (ref 3.87–5.11)
RDW: 12.1 % (ref 11.5–15.5)
WBC: 8.5 10*3/uL (ref 4.0–10.5)
nRBC: 0 % (ref 0.0–0.2)

## 2021-10-09 LAB — HIV ANTIBODY (ROUTINE TESTING W REFLEX): HIV Screen 4th Generation wRfx: NONREACTIVE

## 2021-10-09 LAB — TROPONIN I (HIGH SENSITIVITY): Troponin I (High Sensitivity): 3 ng/L (ref <18)

## 2021-10-09 LAB — RESP PANEL BY RT-PCR (FLU A&B, COVID) ARPGX2
Influenza A by PCR: NEGATIVE
Influenza B by PCR: NEGATIVE
SARS Coronavirus 2 by RT PCR: NEGATIVE

## 2021-10-09 LAB — HEPARIN LEVEL (UNFRACTIONATED): Heparin Unfractionated: 0.42 IU/mL (ref 0.30–0.70)

## 2021-10-09 MED ORDER — ALPRAZOLAM 0.25 MG PO TABS: 0.1250 mg | ORAL_TABLET | Freq: Every evening | ORAL | Status: DC | PRN

## 2021-10-09 MED ORDER — APIXABAN 5 MG PO TABS: 10.0000 mg | ORAL_TABLET | Freq: Two times a day (BID) | ORAL | 0 refills | Status: AC

## 2021-10-09 MED ORDER — ALBUTEROL SULFATE (2.5 MG/3ML) 0.083% IN NEBU: 2.5000 mg | INHALATION_SOLUTION | Freq: Four times a day (QID) | RESPIRATORY_TRACT | Status: DC | PRN

## 2021-10-09 MED ORDER — NALOXONE HCL 0.4 MG/ML IJ SOLN: 0.4000 mg | INTRAMUSCULAR | Status: DC | PRN

## 2021-10-09 MED ORDER — APIXABAN 5 MG PO TABS
10.0000 mg | ORAL_TABLET | Freq: Two times a day (BID) | ORAL | Status: DC
  Administered 2021-10-09: 10 mg via ORAL
  Filled 2021-10-09: qty 2

## 2021-10-09 MED ORDER — VENLAFAXINE HCL ER 37.5 MG PO CP24
37.5000 mg | ORAL_CAPSULE | Freq: Every day | ORAL | Status: DC
  Administered 2021-10-09: 37.5 mg via ORAL
  Filled 2021-10-09 (×2): qty 1

## 2021-10-09 MED ORDER — BUDESONIDE 0.25 MG/2ML IN SUSP
0.2500 mg | Freq: Two times a day (BID) | RESPIRATORY_TRACT | Status: DC
  Administered 2021-10-09 (×2): 0.25 mg via RESPIRATORY_TRACT
  Filled 2021-10-09 (×2): qty 2

## 2021-10-09 MED ORDER — HEPARIN (PORCINE) 25000 UT/250ML-% IV SOLN
750.0000 [IU]/h | INTRAVENOUS | Status: AC
  Administered 2021-10-09: 750 [IU]/h via INTRAVENOUS
  Filled 2021-10-09: qty 250

## 2021-10-09 MED ORDER — APIXABAN 5 MG PO TABS: 5.0000 mg | ORAL_TABLET | Freq: Two times a day (BID) | ORAL | Status: DC

## 2021-10-09 MED ORDER — MELATONIN 3 MG PO TABS: 1.5000 mg | ORAL_TABLET | Freq: Every evening | ORAL | Status: DC | PRN

## 2021-10-09 MED ORDER — APIXABAN 5 MG PO TABS: 5.0000 mg | ORAL_TABLET | Freq: Two times a day (BID) | ORAL | 1 refills | Status: AC

## 2021-10-09 MED ORDER — HYDROCODONE-ACETAMINOPHEN 5-325 MG PO TABS: 1.0000 | ORAL_TABLET | Freq: Four times a day (QID) | ORAL | Status: DC | PRN

## 2021-10-09 MED ORDER — FLUTICASONE PROPIONATE HFA 44 MCG/ACT IN AERO
2.0000 | INHALATION_SPRAY | Freq: Every morning | RESPIRATORY_TRACT | Status: DC
  Filled 2021-10-09: qty 10.6

## 2021-10-09 MED ORDER — HEPARIN BOLUS VIA INFUSION
3000.0000 [IU] | Freq: Once | INTRAVENOUS | Status: AC
  Administered 2021-10-09: 3000 [IU] via INTRAVENOUS
  Filled 2021-10-09: qty 3000

## 2021-10-09 NOTE — Progress Notes (Signed)
ANTICOAGULATION CONSULT NOTE - Initial Consult  Pharmacy Consult for Heparin Indication: pulmonary embolus  Allergies  Allergen Reactions   Keflex [Cephalexin] Anaphylaxis and Nausea And Vomiting    Patient states abdominal pain also   Macrobid  [Nitrofurantoin Macrocrystal] Nausea Only   Cucumber Extract     Other reaction(s): stomach upset   Other     Other reaction(s): stomach upset   Sulfamethoxazole-Trimethoprim     Other reaction(s): stomach upset   Macrobid [Nitrofurantoin Monohyd Macro] Nausea And Vomiting    Abdominal pain    Patient Measurements: Height: 5\' 3"  (160 cm) Weight: 45.4 kg (100 lb) IBW/kg (Calculated) : 52.4 Heparin Dosing Weight: 45 kg  Vital Signs: Temp: 98 F (36.7 C) (11/23 2023) Temp Source: Oral (11/23 1615) BP: 120/72 (11/23 2300) Pulse Rate: 69 (11/23 2300)  Labs: Recent Labs    10/08/21 1638 10/08/21 1825  HGB 13.5  --   HCT 41.6  --   PLT 277  --   CREATININE 0.69  --   TROPONINIHS 3 2    Estimated Creatinine Clearance: 48.2 mL/min (by C-G formula based on SCr of 0.69 mg/dL).   Medical History: Past Medical History:  Diagnosis Date   Anxiety    Arthritis    Asthma    Corneal laceration of right eye    GERD (gastroesophageal reflux disease)    asymptomatic   Headache(784.0)    migraine   IBS (irritable bowel syndrome)    Migraine    Nausea and vomiting    After surgery   Weight loss     Medications:  See electronic med rec  Assessment: 70 y.o. F presents with CP. Found to have small subsegmental PE on CT. To begin heparin. CBC ok on admission. No AC PTA.  Goal of Therapy:  Heparin level 0.3-0.7 units/ml Monitor platelets by anticoagulation protocol: Yes   Plan:  Heparin IV bolus 3000 units Heparin gtt at 750 units/hr Will f/u heparin level in 6 hours Daily heparin level and CBC  Sherlon Handing, PharmD, BCPS Please see amion for complete clinical pharmacist phone list 10/09/2021,12:09 AM

## 2021-10-09 NOTE — Progress Notes (Signed)
  Echocardiogram 2D Echocardiogram has been performed.  Emily White 10/09/2021, 11:53 AM

## 2021-10-09 NOTE — Plan of Care (Signed)

## 2021-10-09 NOTE — Progress Notes (Addendum)
ANTICOAGULATION CONSULT NOTE - Follow Up Consult  Pharmacy Consult for Heparin >> Apixaban Indication: pulmonary embolus  Allergies  Allergen Reactions   Keflex [Cephalexin] Anaphylaxis and Nausea And Vomiting    Patient states abdominal pain also   Macrobid  [Nitrofurantoin Macrocrystal] Nausea Only   Cucumber Extract     Other reaction(s): stomach upset   Other     Other reaction(s): stomach upset   Sulfamethoxazole-Trimethoprim     Other reaction(s): stomach upset   Macrobid [Nitrofurantoin Monohyd Macro] Nausea And Vomiting    Abdominal pain    Patient Measurements: Height: 5\' 2"  (157.5 cm) Weight: 46.6 kg (102 lb 11.2 oz) IBW/kg (Calculated) : 50.1 Heparin Dosing Weight: 46.6 kg  Vital Signs: Temp: 98 F (36.7 C) (11/24 0222) Temp Source: Oral (11/24 0222) BP: 137/95 (11/24 0222) Pulse Rate: 72 (11/24 0222)  Labs: Recent Labs    10/08/21 1638 10/08/21 1825 10/09/21 0210 10/09/21 0634  HGB 13.5  --  13.1  --   HCT 41.6  --  39.2  --   PLT 277  --  267  --   HEPARINUNFRC  --   --   --  0.42  CREATININE 0.69  --  0.63  --   TROPONINIHS 3 2  --  3    Estimated Creatinine Clearance: 49.5 mL/min (by C-G formula based on SCr of 0.63 mg/dL).   Medications:  Scheduled:   budesonide (PULMICORT) nebulizer solution  0.25 mg Nebulization BID   venlafaxine XR  37.5 mg Oral Q breakfast   Infusions:   heparin 750 Units/hr (10/09/21 0030)    Assessment: 70 yo F presenting with CP. Found to have small subsegmental PE on CT. Patient was not on anticoagulation PTA. Pharmacy consulted to dose heparin.   Heparin level this morning therapeutic at 0.42, on 750 units/hr. CBC stable. No line issues or signs/symptoms of bleeding noted per RN.   Goal of Therapy:  Heparin level 0.3-0.7 units/ml Monitor platelets by anticoagulation protocol: Yes   Plan:  Continue heparin at 750 units/hr Check ~6hr heparin level Daily CBC and heparin level Monitor for signs/symptoms of  bleeding   Addendum: Pharmacy consulted to transition the patient from heparin to apixaban for treatment of acute PE.   Plan: Stop heparin Start apixaban 10 mg PO BID x 7 days, followed by 5 mg PO BID Monitor CBC and for signs/symptoms of bleeding    Vance Peper, PharmD PGY1 Pharmacy Resident Phone [phone] 10/09/2021 7:45 AM   Please check AMION for all [place] phone numbers After 10:00 PM, call [place] [phone]

## 2021-10-09 NOTE — Discharge Summary (Signed)
Physician Discharge Summary  Emily White IRS:[id number] DOB: [birth year]  PCP: Mayra Neer, MD  Admitted from: Home Discharged to: Home  Admit date: 10/08/2021 Discharge date: 10/09/2021  Recommendations for Outpatient Follow-up:    Follow-up Information     Mayra Neer, MD. Schedule an appointment as soon as possible for a visit in 1 week(s).   Specialty: Family Medicine Why: To be seen with repeat labs (CBC & BMP).  Patient advised to call MDs office when they open next, tomorrow 11/25 or Monday 11/28 to discuss regarding her estrogen medication.  Recommend outpatient hematology consultation to determine need for hypercoagulable work-up and duration of anticoagulation.  Also recommend outpatient GYN follow-up. Contact information: [address] [place] [address] [phone]                  Home Health: None    Equipment/Devices: None    Discharge Condition: Improved and stable.   Code Status: Full Code Diet recommendation:  Discharge Diet Orders (From admission, onward)     Start     Ordered   10/09/21 0000  Diet - low sodium heart healthy        10/09/21 1142             Discharge Diagnoses:  Principal Problem:   Acute pulmonary embolism ([place]) Active Problems:   Atypical chest pain   GAD (generalized anxiety disorder)   Asthma   Brief Summary: 70 year old widowed female (spouse was a ED physician with the [place] system, Dr. Jeanette Caprice), independent and physically quite active (walks about 1.5 miles 3 times a week and does intensive yoga also about 3 times a week), medical history significant for acute pulmonary embolism several years ago (states 10 to 15 years ago) also in the context of being on oral estrogen replacement (has been on this since age 44 following hysterectomy), chronic oral estrogen replacement, anxiety, mild persistent asthma, GERD, migraine, former tobacco use, presented to the ED on 10/08/2021  with complaints of chest pain.    Details of her initial presentation is as per H&P by Dr. Babs Bertin as has been copied below:   "The patient reports that she awoke from sleep around 0100 on 10/08/21 experiencing sharp/achy, substernal chest discomfort radiating into the right chest, worsening with deep inspiration as well as with leaning forward.  She notes that the pain was nonexertional and not reproducible with palpation over the anterior chest wall.  She is unsure as to the specific duration of this episode of chest pain, noting that she was able to fall back asleep while still experiencing the chest discomfort, before awakening several hours later with interval resolution of the chest discomfort.  She has subsequently not experienced any prolonged episodes of similar chest discomfort following this initial episode, she reports reproducibility of the quality, intensity, and location of the chest pain that she is experiencing last night with deep inspiration.  Has not taken any nitroglycerin in response to this chest discomfort.  Denies any associated shortness of breath, nausea, vomiting, diaphoresis, or palpitations.  Not associated with any subjective fever, chills, rigors, cough, hemoptysis, orthopnea, or PND.  She conveys that she is currently chest pain-free.   Denies any known history of coronary artery disease.  She notes that her brother was diagnosed with a myocardial infarction in his 73s, and that her mother was diagnosed with coronary artery disease in her 15s.  She conveys that she is a former smoker, having completely quit  smoking in the 1970s after a 10-pack-year history of cigarette smoking.  Denies any known history of underlying attention, diabetes.  She is on a daily baby aspirin at home, otherwise no additional blood thinners.   Denies any recent trauma, travel, surgery, extended periods of diminishing ambulatory activity, or any personal history of malignancy.  Denies any new  peripheral swelling, erythema, or hemoptysis.  However, she does report noting bilateral calf tenderness over the last few weeks, which is new for her.   Denies any history of previous similar chest discomfort, although she notes that she was diagnosed with an acute pulmonary embolism approximately 10 years ago, and notes that she was not treated with formal anticoagulation response to this.  She does not recall if this was a subsegmental pulmonary embolism, or specifically the laterality of this prior acute pulmonary embolism.        ED Course:  Vital signs in the ED were notable for the following: Temperature max 98.0, heart rate 68-72; blood pressure 120/75 -136/83; respiratory rate 17-18, oxygen saturation 98 to 100% on room air.    Labs were notable for the following: Initial high-sensitivity troponin I found to be 3, previous value trending down 2.  CBC notable for white blood cell count 7200, hemoglobin 13.5.  COVID-19/influenza PCR checked in the ED today was found to be negative.   Imaging and additional notable ED work-up: EKG, with no prior will for point comparison shows sinus rhythm with a rate 72, normal intervals, no evidence of T wave or ST changes, including no evidence of ST elevation.  Chest x-ray showed no evidence of acute cardiopulmonary process, including no evidence of pneumothorax.  CTA chest showed a small acute right lower lobe pulmonary embolism with clear lung fields bilaterally, no additional evidence of acute intrathoracic process.   While in the ED, the following were administered: Heparin bolus followed by initiation of heparin drip."  She was admitted for evaluation and management of acute PE.  Assessment and plan:  Acute pulmonary embolism - CTA chest showed small nonocclusive embolus right lower lobe distal segmental/subsegmental pulmonary vessel. - Prior history of acute PE more than a decade ago.  As per patient report, this was also in the context of being  on estrogen supplements.  She also repeatedly states that she was never on anticoagulation at that time.  PCP had recommended that she discontinue estrogen but her GYN MD felt that this was less likely to be the cause and recommended continuing estrogen and hence has been on the same. - Hemodynamically stable.  Not hypoxic.  No right heart strain noted on CTA chest.  Her presenting symptoms of chest pain likely related to acute PE.  Reviewed 2D echo results, normal LVEF and no features of right heart strain. - Given complaint of bilateral lower extremity discomfort, obtained lower extremity venous Dopplers which were negative for DVT. - Initially started on IV heparin infusion by EDP. - Reviewed with patient in detail regarding risks and benefits of anticoagulation including bleeding risks, available choices for switch to oral anticoagulation including warfarin versus DOAC's (Eliquis and Xarelto) versus Pradaxa, risks and benefits of each.  After careful consideration, patient opted for Eliquis and she was switched to Eliquis. - Discontinue estrogen supplements at discharge with clear advised to patient that she is to call her PCPs office as soon as possible regarding further management of this i.e. discontinue indefinitely or need to continue.  Also recommend outpatient GYN follow-up. - This may be provoked  PE like the previous one in the context of estrogen supplements.  If the provoking agent i.e. estrogen is discontinued, duration of anticoagulation may be 6 months.  However defer to outpatient follow-up with PCP to determine duration of anticoagulation.  May also consider outpatient hematology consultation for consideration for hypercoagulable work-up and advice regarding anticoagulation duration. - Patient on several over-the-counter herbal supplements and unclear if any of these have hypercoagulable features and these should be carefully reviewed as outpatient.  Chest pain - Not typical chest pain  for angina - Possibly related to acute pulmonary embolism. - EKG without acute changes and high-sensitivity troponins trended x3 and negative. - Resolved without recurrence. - Reports no history of CAD, MI.  Reportedly had stress test several years ago which was negative.  States that she was on aspirin likely related to the prior pulmonary embolism.  Aspirin now discontinued while she is on Eliquis anticoagulation especially given no clear indication for continued aspirin.  Also recommended avoiding NSAIDs to minimize bleeding risks. - Could consider outpatient cardiology consultation for further evaluation as deemed necessary  Mild persistent asthma - No clinical exacerbation.  Continue home regimen.  Anxiety disorder - Continue home medications.  Migraine headaches - Reports taking Effexor for this.  Avoid NSAIDs to minimize bleeding risks while on full anticoagulation.  Chronic estrogen replacement - Kindly see discussion above.  Consultations: None  Procedures: None   Discharge Instructions  Discharge Instructions     Call MD for:  difficulty breathing, headache or visual disturbances   Complete by: As directed    Call MD for:  extreme fatigue   Complete by: As directed    Call MD for:  persistant dizziness or light-headedness   Complete by: As directed    Call MD for:  severe uncontrolled pain   Complete by: As directed    Diet - low sodium heart healthy   Complete by: As directed    Increase activity slowly   Complete by: As directed         Medication List     STOP taking these medications    aspirin EC 81 MG tablet   estradiol 0.5 MG tablet Commonly known as: ESTRACE   ibuprofen 200 MG tablet Commonly known as: ADVIL   ondansetron 4 MG disintegrating tablet Commonly known as: Zofran ODT       TAKE these medications    acetaminophen 500 MG tablet Commonly known as: TYLENOL Take 1,000 mg by mouth every 6 (six) hours as needed for moderate  pain or headache.   albuterol 108 (90 Base) MCG/ACT inhaler Commonly known as: VENTOLIN HFA Inhale 1 puff into the lungs every 6 (six) hours as needed for wheezing or shortness of breath.   ALPRAZolam 0.25 MG tablet Commonly known as: XANAX Take 0.125 mg by mouth at bedtime as needed for anxiety or sleep.   apixaban 5 MG Tabs tablet Commonly known as: ELIQUIS Take 2 tablets (10 mg total) by mouth 2 (two) times daily for 13 doses. After completing this prescription, start the second prescription of Eliquis.   apixaban 5 MG Tabs tablet Commonly known as: ELIQUIS Take 1 tablet (5 mg total) by mouth 2 (two) times daily. This is to be started only after you finish the first prescription of Eliquis. Start taking on: October 16, 2021 Notes to patient: Starting 12/7   BIOTIN PO Take 1 capsule by mouth daily.   DIGESTIVE ENZYME PO Take 86 mg by mouth daily.   fluticasone 44  MCG/ACT inhaler Commonly known as: FLOVENT HFA Inhale 2 puffs into the lungs every morning.   Magnesium Glycinate 665 MG Caps Take 665 mg by mouth at bedtime.   melatonin 1 MG Tabs tablet Take 1 tablet by mouth at bedtime as needed (for sleep).   OVER THE COUNTER MEDICATION Take 1 tablet by mouth daily. Astragalus-for immune function   OVER THE COUNTER MEDICATION Take 1 tablet by mouth daily. Black current seed   OVER THE COUNTER MEDICATION Ashwaganda- immune system supplement   PROBIOTIC DAILY PO Take 1 capsule by mouth daily.   venlafaxine XR 37.5 MG 24 hr capsule Commonly known as: EFFEXOR-XR Take 1 capsule (37.5 mg total) by mouth daily with breakfast.   vitamin B-12 1000 MCG tablet Commonly known as: CYANOCOBALAMIN Take 1,000 mcg by mouth daily.   vitamin C 500 MG tablet Commonly known as: ASCORBIC ACID Take 500 mg by mouth daily.   Vitamin D 50 MCG (2000 UT) tablet Take 2,000 Units by mouth daily.       Allergies  Allergen Reactions   Keflex [Cephalexin] Anaphylaxis and Nausea And  Vomiting    Patient states abdominal pain also   Macrobid  [Nitrofurantoin Macrocrystal] Nausea Only   Cucumber Extract     Other reaction(s): stomach upset   Other     Other reaction(s): stomach upset   Sulfamethoxazole-Trimethoprim     Other reaction(s): stomach upset   Macrobid [Nitrofurantoin Monohyd Macro] Nausea And Vomiting    Abdominal pain      Procedures/Studies: DG Chest 2 View  Result Date: 10/08/2021 CLINICAL DATA:  Chest pain.  Ex-smoker. EXAM: CHEST - 2 VIEW COMPARISON:  07/30/2019 FINDINGS: Midline trachea. Normal heart size and mediastinal contours. No pleural effusion or pneumothorax. Diffuse peribronchial thickening. Clear lungs. IMPRESSION: 1. No acute cardiopulmonary disease. 2. Mild peribronchial thickening which may relate to chronic bronchitis or smoking. Electronically Signed   By: Abigail Miyamoto M.D.   On: 10/08/2021 17:04   CT Angio Chest PE W and/or Wo Contrast  Result Date: 10/08/2021 CLINICAL DATA:  Chest pain EXAM: CT ANGIOGRAPHY CHEST WITH CONTRAST TECHNIQUE: Multidetector CT imaging of the chest was performed using the standard protocol during bolus administration of intravenous contrast. Multiplanar CT image reconstructions and MIPs were obtained to evaluate the vascular anatomy. CONTRAST:  77mL OMNIPAQUE IOHEXOL 350 MG/ML SOLN COMPARISON:  Chest x-ray 10/08/2021, CT chest 11/28/2010 FINDINGS: Cardiovascular: Satisfactory opacification of the pulmonary arteries to the segmental level. Small nonocclusive embolus within right lower lobe distal segmental/subsegmental pulmonary vessel, series 7 image 161. Nonaneurysmal aorta. Normal cardiac size. No pericardial effusion Mediastinum/Nodes: No enlarged mediastinal, hilar, or axillary lymph nodes. Thyroid gland, trachea, and esophagus demonstrate no significant findings. Lungs/Pleura: Mild atelectasis and scarring in the right base. No acute consolidation, pleural effusion or pneumothorax. Upper Abdomen: No acute  abnormality. Musculoskeletal: No chest wall abnormality. No acute or significant osseous findings. Review of the MIP images confirms the above findings. IMPRESSION: 1. Small nonocclusive embolus right lower lobe distal segmental/subsegmental pulmonary vessel. 2. Clear lung fields Critical Value/emergent results were called by telephone at the time of interpretation on 10/08/2021 at 11:55 pm to provider Dr. Roxanne Mins, who verbally acknowledged these results. Electronically Signed   By: Donavan Foil M.D.   On: 10/08/2021 23:55   ECHOCARDIOGRAM COMPLETE  Result Date: 10/09/2021    ECHOCARDIOGRAM REPORT   Patient Name:   Emily White Date of Exam: 10/09/2021 Medical Rec #:  [id number]       Height:  62.0 in Accession #:    [id number]      Weight:       102.7 lb Date of Birth:  1951-07-23        BSA:          1.440 m Patient Age:    39 years        BP:           137/95 mmHg Patient Gender: F               HR:           64 bpm. Exam Location:  Inpatient Procedure: 2D Echo, 3D Echo, Cardiac Doppler and Color Doppler Indications:    R07.9* Chest pain, unspecified  History:        Patient has no prior history of Echocardiogram examinations.                 Signs/Symptoms:Chest Pain. Acute pulmonary embolism.  Sonographer:    Roseanna Rainbow RDCS Referring Phys: [id number] Matther Labell D Keundra Petrucelli  Sonographer Comments: Image acquisition challenging due to respiratory motion. IMPRESSIONS  1. Left ventricular ejection fraction by 3D volume is 60 %. The left ventricle has normal function. The left ventricle has no regional wall motion abnormalities. Left ventricular diastolic parameters were normal.  2. Right ventricular systolic function is normal. The right ventricular size is normal. Tricuspid regurgitation signal is inadequate for assessing PA pressure.  3. The mitral valve is grossly normal. Mild mitral valve regurgitation. No evidence of mitral stenosis.  4. The aortic valve is tricuspid. Aortic valve regurgitation is not visualized.  No aortic stenosis is present.  5. The inferior vena cava is dilated in size with >50% respiratory variability, suggesting right atrial pressure of 8 mmHg. FINDINGS  Left Ventricle: Left ventricular ejection fraction by 3D volume is 60 %. The left ventricle has normal function. The left ventricle has no regional wall motion abnormalities. The left ventricular internal cavity size was normal in size. There is no left  ventricular hypertrophy. Left ventricular diastolic parameters were normal. Right Ventricle: The right ventricular size is normal. No increase in right ventricular wall thickness. Right ventricular systolic function is normal. Tricuspid regurgitation signal is inadequate for assessing PA pressure. Left Atrium: Left atrial size was normal in size. Right Atrium: Right atrial size was normal in size. Pericardium: Trivial pericardial effusion is present. Mitral Valve: The mitral valve is grossly normal. Mild mitral valve regurgitation. No evidence of mitral valve stenosis. Tricuspid Valve: The tricuspid valve is normal in structure. Tricuspid valve regurgitation is trivial. No evidence of tricuspid stenosis. Aortic Valve: The aortic valve is tricuspid. Aortic valve regurgitation is not visualized. No aortic stenosis is present. Pulmonic Valve: The pulmonic valve was normal in structure. Pulmonic valve regurgitation is trivial. No evidence of pulmonic stenosis. Aorta: The aortic root is normal in size and structure. Venous: The inferior vena cava is dilated in size with greater than 50% respiratory variability, suggesting right atrial pressure of 8 mmHg. IAS/Shunts: The interatrial septum was not well visualized.  LEFT VENTRICLE PLAX 2D LVIDd:         4.10 cm         Diastology LVIDs:         2.70 cm         LV e' medial:    10.10 cm/s LV PW:         0.60 cm         LV E/e' medial:  7.9 LV IVS:  0.80 cm         LV e' lateral:   10.80 cm/s LVOT diam:     1.90 cm         LV E/e' lateral: 7.4 LV SV:          52 LV SV Index:   36 LVOT Area:     2.84 cm        3D Volume EF                                LV 3D EF:    Left                                             ventricul LV Volumes (MOD)                            ar LV vol d, MOD    64.6 ml                    ejection A2C:                                        fraction LV vol d, MOD    56.9 ml                    by 3D A4C:                                        volume is LV vol s, MOD    25.4 ml                    60 %. A2C: LV vol s, MOD    26.8 ml A4C:                           3D Volume EF: LV SV MOD A2C:   39.2 ml       3D EF:        60 % LV SV MOD A4C:   56.9 ml       LV EDV:       83 ml LV SV MOD BP:    36.1 ml       LV ESV:       33 ml                                LV SV:        50 ml RIGHT VENTRICLE            IVC RV S prime:     9.79 cm/s  IVC diam: 2.40 cm TAPSE (M-mode): 1.5 cm LEFT ATRIUM           Index        RIGHT ATRIUM          Index LA diam:      2.50 cm 1.74 cm/m   RA Area:     5.88 cm  LA Vol (A2C): 10.6 ml 7.36 ml/m   RA Volume:   8.16 ml  5.67 ml/m LA Vol (A4C): 15.0 ml 10.42 ml/m  AORTIC VALVE LVOT Vmax:   95.30 cm/s LVOT Vmean:  59.000 cm/s LVOT VTI:    0.182 m  AORTA Ao Root diam: 2.60 cm Ao Asc diam:  2.70 cm MITRAL VALVE MV Area (PHT): 4.39 cm    SHUNTS MV Decel Time: 173 msec    Systemic VTI:  0.18 m MV E velocity: 80.10 cm/s  Systemic Diam: 1.90 cm MV A velocity: 78.70 cm/s MV E/A ratio:  1.02 Cherlynn Kaiser MD Electronically signed by Cherlynn Kaiser MD Signature Date/Time: 10/09/2021/5:09:48 PM    Final    VAS [place] LOWER EXTREMITY VENOUS (DVT)  Result Date: 10/09/2021  Lower Venous DVT Study Patient Name:  Emily White  Date of Exam:   10/09/2021 Medical Rec #: [id number]        Accession #:    [id number] Date of Birth: [birth year]         Patient Gender: F Patient Age:   49 years Exam Location:  [facility] Procedure:      VAS [place] LOWER EXTREMITY VENOUS (DVT) Referring Phys: Kinsley Holderman  --------------------------------------------------------------------------------  Indications: Pulmonary embolism.  Risk Factors: On chronic estrogen therapy. Comparison Study: Prior negative bilateral LEV done 11/28/2010 Performing Technologist: Sharion Dove RVS  Examination Guidelines: A complete evaluation includes B-mode imaging, spectral Doppler, color Doppler, and power Doppler as needed of all accessible portions of each vessel. Bilateral testing is considered an integral part of a complete examination. Limited examinations for reoccurring indications may be performed as noted. The reflux portion of the exam is performed with the patient in reverse Trendelenburg.  +---------+---------------+---------+-----------+----------+--------------+ RIGHT    CompressibilityPhasicitySpontaneityPropertiesThrombus Aging +---------+---------------+---------+-----------+----------+--------------+ CFV      Full           Yes      Yes                                 +---------+---------------+---------+-----------+----------+--------------+ SFJ      Full                                                        +---------+---------------+---------+-----------+----------+--------------+ FV Prox  Full                                                        +---------+---------------+---------+-----------+----------+--------------+ FV Mid   Full                                                        +---------+---------------+---------+-----------+----------+--------------+ FV DistalFull                                                        +---------+---------------+---------+-----------+----------+--------------+  PFV      Full                                                        +---------+---------------+---------+-----------+----------+--------------+ POP      Full           Yes      Yes                                  +---------+---------------+---------+-----------+----------+--------------+ PTV      Full                                                        +---------+---------------+---------+-----------+----------+--------------+ PERO     Full                                                        +---------+---------------+---------+-----------+----------+--------------+   +---------+---------------+---------+-----------+----------+--------------+ LEFT     CompressibilityPhasicitySpontaneityPropertiesThrombus Aging +---------+---------------+---------+-----------+----------+--------------+ CFV      Full           Yes      Yes                                 +---------+---------------+---------+-----------+----------+--------------+ SFJ      Full                                                        +---------+---------------+---------+-----------+----------+--------------+ FV Prox  Full                                                        +---------+---------------+---------+-----------+----------+--------------+ FV Mid   Full                                                        +---------+---------------+---------+-----------+----------+--------------+ FV DistalFull                                                        +---------+---------------+---------+-----------+----------+--------------+ PFV      Full                                                        +---------+---------------+---------+-----------+----------+--------------+  POP      Full           Yes      Yes                                 +---------+---------------+---------+-----------+----------+--------------+ PTV      Full                                                        +---------+---------------+---------+-----------+----------+--------------+ PERO     Full                                                         +---------+---------------+---------+-----------+----------+--------------+     Summary: BILATERAL: - No evidence of deep vein thrombosis seen in the lower extremities, bilaterally. -No evidence of popliteal cyst, bilaterally.   *See table(s) above for measurements and observations.    Preliminary       Subjective: Denies complaints.  No recurrence of chest pain.  No dyspnea, dizziness or lightheadedness.  Discharge Exam:  Vitals:   10/09/21 0130 10/09/21 0222 10/09/21 0817 10/09/21 1402  BP: 128/89 (!) 137/95  114/73  Pulse: (!) 57 72 72 80  Resp:  18 18 19   Temp:  98 F (36.7 C)  98.2 F (36.8 C)  TempSrc:  Oral  Oral  SpO2: 99% 99% 99% 98%  Weight:  46.6 kg    Height:  5\' 2"  (1.575 m)      General: Pleasant middle-age female, moderately built and nourished sitting up comfortably in bed without distress. Cardiovascular: S1 & S2 heard, RRR, S1/S2 +. No murmurs, rubs, gallops or clicks. No JVD or pedal edema. Respiratory: Clear to auscultation without wheezing, rhonchi or crackles. No increased work of breathing.  No pleural rubs.  Telemetry personally reviewed: Sinus rhythm. Abdominal:  Non distended, non tender & soft. No organomegaly or masses appreciated. Normal bowel sounds heard. CNS: Alert and oriented. No focal deficits. Extremities: no edema, no cyanosis.  No asymmetric swelling noted.    The results of significant diagnostics from this hospitalization (including imaging, microbiology, ancillary and laboratory) are listed below for reference.     Microbiology: Recent Results (from the past 240 hour(s))  Resp Panel by RT-PCR (Flu A&B, Covid) Nasopharyngeal Swab     Status: None   Collection Time: 10/08/21 11:16 PM   Specimen: Nasopharyngeal Swab; Nasopharyngeal(NP) swabs in vial transport medium  Result Value Ref Range Status   SARS Coronavirus 2 by RT PCR NEGATIVE NEGATIVE Final    Comment: (NOTE) SARS-CoV-2 target nucleic acids are NOT DETECTED.  The SARS-CoV-2  RNA is generally detectable in upper respiratory specimens during the acute phase of infection. The lowest concentration of SARS-CoV-2 viral copies this assay can detect is 138 copies/mL. A negative result does not preclude SARS-Cov-2 infection and should not be used as the sole basis for treatment or other patient management decisions. A negative result may occur with  improper specimen collection/handling, submission of specimen other than nasopharyngeal swab, presence of viral mutation(s) within the areas targeted by this assay, and inadequate number of viral copies(<138 copies/mL).  A negative result must be combined with clinical observations, patient history, and epidemiological information. The expected result is Negative.  Fact Sheet for Patients:  EntrepreneurPulse.com.au  Fact Sheet for Healthcare Providers:  IncredibleEmployment.be  This test is no t yet approved or cleared by the [place] FDA and  has been authorized for detection and/or diagnosis of SARS-CoV-2 by FDA under an Emergency Use Authorization (EUA). This EUA will remain  in effect (meaning this test can be used) for the duration of the COVID-19 declaration under Section 564(b)(1) of the Act, 21 U.S.C.section 360bbb-3(b)(1), unless the authorization is terminated  or revoked sooner.       Influenza A by PCR NEGATIVE NEGATIVE Final   Influenza B by PCR NEGATIVE NEGATIVE Final    Comment: (NOTE) The Xpert Xpress SARS-CoV-2/FLU/RSV plus assay is intended as an aid in the diagnosis of influenza from Nasopharyngeal swab specimens and should not be used as a sole basis for treatment. Nasal washings and aspirates are unacceptable for Xpert Xpress SARS-CoV-2/FLU/RSV testing.  Fact Sheet for Patients: EntrepreneurPulse.com.au  Fact Sheet for Healthcare Providers: IncredibleEmployment.be  This test is not yet approved or cleared by the  [place] FDA and has been authorized for detection and/or diagnosis of SARS-CoV-2 by FDA under an Emergency Use Authorization (EUA). This EUA will remain in effect (meaning this test can be used) for the duration of the COVID-19 declaration under Section 564(b)(1) of the Act, 21 U.S.C. section 360bbb-3(b)(1), unless the authorization is terminated or revoked.  Performed at [facility], [place] [address] [place], [place] [address]      Labs: CBC: Recent Labs  Lab 10/08/21 1638 10/09/21 0210  WBC 7.2 8.5  HGB 13.5 13.1  HCT 41.6 39.2  MCV 91.4 90.3  PLT 277 916    Basic Metabolic Panel: Recent Labs  Lab 10/08/21 1638 10/09/21 0210  NA 139 137  K 3.5 3.5  CL 102 103  CO2 28 28  GLUCOSE 127* 94  BUN 7* 9  CREATININE 0.69 0.63  CALCIUM 9.6 9.0  MG  --  2.0    Liver Function Tests: Recent Labs  Lab 10/09/21 0210  AST 21  ALT 18  ALKPHOS 59  BILITOT 0.6  PROT 6.4*  ALBUMIN 4.0      Hgb A1c Recent Labs    10/09/21 0634  HGBA1C 5.1       Time coordinating discharge: 25 minutes  SIGNED:  Vernell Leep, MD,  FACP, [facility], [facility], [facility] (Care Management Physician Certified). [facility]  To contact the attending provider between 7A-7P or the covering provider during after hours 7P-7A, please log into the web site [email or web address] and access using universal [place] password for that web site. If you do not have the password, please call the hospital operator.

## 2021-10-09 NOTE — Progress Notes (Signed)
VASCULAR LAB    Bilateral lower extremity venous duplex has been performed.  See CV proc for preliminary results.   Epsie Walthall, RVT 10/09/2021, 3:03 PM

## 2021-10-09 NOTE — Discharge Instructions (Addendum)
Information on my medicine - ELIQUIS (apixaban)  This medication education was reviewed with me or my healthcare representative as part of my discharge preparation.    Why was Eliquis prescribed for you? Eliquis was prescribed for you to reduce the risk of forming blood clots that can cause a stroke if you have a medical condition called atrial fibrillation (a type of irregular heartbeat) OR to reduce the risk of a blood clots forming after orthopedic surgery.  What do You need to know about Eliquis ? Take your Eliquis TWICE DAILY - one tablet in the morning and one tablet in the evening with or without food.  It would be best to take the doses about the same time each day.  If you have difficulty swallowing the tablet whole please discuss with your pharmacist how to take the medication safely.  Take Eliquis exactly as prescribed by your doctor and DO NOT stop taking Eliquis without talking to the doctor who prescribed the medication.  Stopping may increase your risk of developing a new clot or stroke.  Refill your prescription before you run out.  After discharge, you should have regular check-up appointments with your healthcare provider that is prescribing your Eliquis.  In the future your dose may need to be changed if your kidney function or weight changes by a significant amount or as you get older.  What do you do if you miss a dose? If you miss a dose, take it as soon as you remember on the same day and resume taking twice daily.  Do not take more than one dose of ELIQUIS at the same time.  Important Safety Information A possible side effect of Eliquis is bleeding. You should call your healthcare provider right away if you experience any of the following: Bleeding from an injury or your nose that does not stop. Unusual colored urine (red or dark brown) or unusual colored stools (red or black). Unusual bruising for unknown reasons. A serious fall or if you hit your head (even  if there is no bleeding).  Some medicines may interact with Eliquis and might increase your risk of bleeding or clotting while on Eliquis. To help avoid this, consult your healthcare provider or pharmacist prior to using any new prescription or non-prescription medications, including herbals, vitamins, non-steroidal anti-inflammatory drugs (NSAIDs) and supplements.  This website has more information on Eliquis (apixaban): [email or web address].    Additional discharge instructions  Please get your medications reviewed and adjusted by your Primary MD.  Please request your Primary MD to go over all Hospital Tests and Procedure/Radiological results at the follow up, please get all Hospital records sent to your Prim MD by signing hospital release before you go home.  If you had Pneumonia of Lung problems at the Hospital: Please get a 2 view Chest X ray done in 6-8 weeks after hospital discharge or sooner if instructed by your Primary MD.  If you have Congestive Heart Failure: Please call your Cardiologist or Primary MD anytime you have any of the following symptoms:  1) 3 pound weight gain in 24 hours or 5 pounds in 1 week  2) shortness of breath, with or without a dry hacking cough  3) swelling in the hands, feet or stomach  4) if you have to sleep on extra pillows at night in order to breathe  Follow cardiac low salt diet and 1.5 lit/day fluid restriction.  If you have diabetes Accuchecks 4 times/day, Once in AM empty stomach and then before  each meal. Log in all results and show them to your primary doctor at your next visit. If any glucose reading is under 80 or above 300 call your primary MD immediately.  If you have Seizure/Convulsions/Epilepsy: Please do not drive, operate heavy machinery, participate in activities at heights or participate in high speed sports until you have seen by Primary MD or a Neurologist and advised to do so again.  If you had Gastrointestinal  Bleeding: Please ask your Primary MD to check a complete blood count within one week of discharge or at your next visit. Your endoscopic/colonoscopic biopsies that are pending at the time of discharge, will also need to followed by your Primary MD.  Get Medicines reviewed and adjusted. Please take all your medications with you for your next visit with your Primary MD  Please request your Primary MD to go over all hospital tests and procedure/radiological results at the follow up, please ask your Primary MD to get all Hospital records sent to his/her office.  If you experience worsening of your admission symptoms, develop shortness of breath, life threatening emergency, suicidal or homicidal thoughts you must seek medical attention immediately by calling 911 or calling your MD immediately  if symptoms less severe.  You must read complete instructions/literature along with all the possible adverse reactions/side effects for all the Medicines you take and that have been prescribed to you. Take any new Medicines after you have completely understood and accpet all the possible adverse reactions/side effects.   Do not drive or operate heavy machinery when taking Pain medications.   Do not take more than prescribed Pain, Sleep and Anxiety Medications  Special Instructions: If you have smoked or chewed Tobacco  in the last 2 yrs please stop smoking, stop any regular Alcohol  and or any Recreational drug use.  Wear Seat belts while driving.  Please note You were cared for by a hospitalist during your hospital stay. If you have any questions about your discharge medications or the care you received while you were in the hospital after you are discharged, you can call the unit and asked to speak with the hospitalist on call if the hospitalist that took care of you is not available. Once you are discharged, your primary care physician will handle any further medical issues. Please note that NO REFILLS for  any discharge medications will be authorized once you are discharged, as it is imperative that you return to your primary care physician (or establish a relationship with a primary care physician if you do not have one) for your aftercare needs so that they can reassess your need for medications and monitor your lab values.  You can reach the hospitalist office at phone [phone] or fax [phone]   If you do not have a primary care physician, you can call [phone] for a physician referral.

## 2021-10-09 NOTE — TOC Transition Note (Signed)
Transition of Care [facility]) - CM/SW Discharge Note   Patient Details  Name: WILHEMENIA CAMBA MRN: [id number] Date of Birth: 07-17-1951  Transition of Care [facility]) CM/SW Contact:  Tom-Johnson, Renea Ee, RN Phone Number: 10/09/2021, 1:46 PM   Clinical Narrative:    CM consulted for discount card for Eliquis for patient. Card given to patient at bedside. Denies any other needs.No further TOC needs noted.   Final next level of care: Home/Self Care Barriers to Discharge: Barriers Resolved   Patient Goals and CMS Choice Patient states their goals for this hospitalization and ongoing recovery are:: To go home CMS Medicare.gov Compare Post Acute Care list provided to:: Patient Choice offered to / list presented to : NA  Discharge Placement                       Discharge Plan and Services                DME Arranged: N/A DME Agency: NA       HH Arranged: NA HH Agency: NA        Social Determinants of Health (SDOH) Interventions     Readmission Risk Interventions No flowsheet data found.

## 2021-10-09 NOTE — ED Notes (Signed)
ED Provider at bedside. 

## 2021-10-22 DIAGNOSIS — Z20822 Contact with and (suspected) exposure to covid-19: Secondary | ICD-10-CM | POA: Diagnosis not present

## 2021-10-22 DIAGNOSIS — J45909 Unspecified asthma, uncomplicated: Secondary | ICD-10-CM | POA: Diagnosis not present

## 2021-10-22 DIAGNOSIS — R059 Cough, unspecified: Secondary | ICD-10-CM | POA: Diagnosis not present

## 2021-10-22 DIAGNOSIS — R197 Diarrhea, unspecified: Secondary | ICD-10-CM | POA: Diagnosis not present

## 2021-10-22 DIAGNOSIS — N951 Menopausal and female climacteric states: Secondary | ICD-10-CM | POA: Diagnosis not present

## 2021-10-22 DIAGNOSIS — I2693 Single subsegmental pulmonary embolism without acute cor pulmonale: Secondary | ICD-10-CM | POA: Diagnosis not present

## 2022-02-05 DIAGNOSIS — H0011 Chalazion right upper eyelid: Secondary | ICD-10-CM | POA: Diagnosis not present

## 2022-02-05 DIAGNOSIS — H2513 Age-related nuclear cataract, bilateral: Secondary | ICD-10-CM | POA: Diagnosis not present

## 2022-02-10 DIAGNOSIS — H0011 Chalazion right upper eyelid: Secondary | ICD-10-CM | POA: Diagnosis not present

## 2022-02-17 DIAGNOSIS — H0011 Chalazion right upper eyelid: Secondary | ICD-10-CM | POA: Diagnosis not present

## 2022-03-09 DIAGNOSIS — H0011 Chalazion right upper eyelid: Secondary | ICD-10-CM | POA: Diagnosis not present

## 2022-03-16 DIAGNOSIS — J45909 Unspecified asthma, uncomplicated: Secondary | ICD-10-CM | POA: Diagnosis not present

## 2022-03-16 DIAGNOSIS — I2693 Single subsegmental pulmonary embolism without acute cor pulmonale: Secondary | ICD-10-CM | POA: Diagnosis not present

## 2022-03-16 DIAGNOSIS — N951 Menopausal and female climacteric states: Secondary | ICD-10-CM | POA: Diagnosis not present

## 2022-04-02 ENCOUNTER — Ambulatory Visit
Admission: RE | Admit: 2022-04-02 | Discharge: 2022-04-02 | Disposition: A | Discharge status: Home / Self Care | Payer: Medicare Other | Source: Ambulatory Visit | Attending: Family Medicine | Admitting: Family Medicine

## 2022-04-02 ENCOUNTER — Other Ambulatory Visit: Payer: Self-pay | Admitting: Family Medicine

## 2022-04-02 DIAGNOSIS — J45901 Unspecified asthma with (acute) exacerbation: Secondary | ICD-10-CM | POA: Diagnosis not present

## 2022-04-02 DIAGNOSIS — R059 Cough, unspecified: Secondary | ICD-10-CM

## 2022-04-02 DIAGNOSIS — J019 Acute sinusitis, unspecified: Secondary | ICD-10-CM | POA: Diagnosis not present

## 2022-04-15 DIAGNOSIS — Z86010 Personal history of colonic polyps: Secondary | ICD-10-CM | POA: Diagnosis not present

## 2022-04-15 DIAGNOSIS — K580 Irritable bowel syndrome with diarrhea: Secondary | ICD-10-CM | POA: Diagnosis not present

## 2022-04-15 DIAGNOSIS — Z86711 Personal history of pulmonary embolism: Secondary | ICD-10-CM | POA: Diagnosis not present

## 2022-04-15 DIAGNOSIS — Z800 Family history of malignant neoplasm of digestive organs: Secondary | ICD-10-CM | POA: Diagnosis not present

## 2022-07-15 DIAGNOSIS — H40009 Preglaucoma, unspecified, unspecified eye: Secondary | ICD-10-CM | POA: Diagnosis not present

## 2022-07-15 DIAGNOSIS — G43909 Migraine, unspecified, not intractable, without status migrainosus: Secondary | ICD-10-CM | POA: Diagnosis not present

## 2022-07-15 DIAGNOSIS — F411 Generalized anxiety disorder: Secondary | ICD-10-CM | POA: Diagnosis not present

## 2022-07-15 DIAGNOSIS — J45909 Unspecified asthma, uncomplicated: Secondary | ICD-10-CM | POA: Diagnosis not present

## 2022-07-22 DIAGNOSIS — H40009 Preglaucoma, unspecified, unspecified eye: Secondary | ICD-10-CM | POA: Diagnosis not present

## 2022-07-22 DIAGNOSIS — J45909 Unspecified asthma, uncomplicated: Secondary | ICD-10-CM | POA: Diagnosis not present

## 2022-07-22 DIAGNOSIS — Z0000 Encounter for general adult medical examination without abnormal findings: Secondary | ICD-10-CM | POA: Diagnosis not present

## 2022-07-22 DIAGNOSIS — Z79899 Other long term (current) drug therapy: Secondary | ICD-10-CM | POA: Diagnosis not present

## 2022-07-22 DIAGNOSIS — F411 Generalized anxiety disorder: Secondary | ICD-10-CM | POA: Diagnosis not present

## 2022-07-22 DIAGNOSIS — Z86711 Personal history of pulmonary embolism: Secondary | ICD-10-CM | POA: Diagnosis not present

## 2022-07-22 DIAGNOSIS — G43909 Migraine, unspecified, not intractable, without status migrainosus: Secondary | ICD-10-CM | POA: Diagnosis not present

## 2022-07-22 DIAGNOSIS — K589 Irritable bowel syndrome without diarrhea: Secondary | ICD-10-CM | POA: Diagnosis not present

## 2022-07-23 ENCOUNTER — Other Ambulatory Visit: Payer: Self-pay | Admitting: Family Medicine

## 2022-07-23 DIAGNOSIS — E2839 Other primary ovarian failure: Secondary | ICD-10-CM

## 2022-08-03 ENCOUNTER — Other Ambulatory Visit: Payer: Self-pay | Admitting: Obstetrics and Gynecology

## 2022-08-03 DIAGNOSIS — Z1231 Encounter for screening mammogram for malignant neoplasm of breast: Secondary | ICD-10-CM

## 2022-08-10 DIAGNOSIS — Z86010 Personal history of colonic polyps: Secondary | ICD-10-CM | POA: Diagnosis not present

## 2022-08-10 DIAGNOSIS — D123 Benign neoplasm of transverse colon: Secondary | ICD-10-CM | POA: Diagnosis not present

## 2022-08-10 DIAGNOSIS — K649 Unspecified hemorrhoids: Secondary | ICD-10-CM | POA: Diagnosis not present

## 2022-08-10 DIAGNOSIS — K6289 Other specified diseases of anus and rectum: Secondary | ICD-10-CM | POA: Diagnosis not present

## 2022-08-10 DIAGNOSIS — D128 Benign neoplasm of rectum: Secondary | ICD-10-CM | POA: Diagnosis not present

## 2022-08-10 DIAGNOSIS — Z09 Encounter for follow-up examination after completed treatment for conditions other than malignant neoplasm: Secondary | ICD-10-CM | POA: Diagnosis not present

## 2022-08-11 DIAGNOSIS — D225 Melanocytic nevi of trunk: Secondary | ICD-10-CM | POA: Diagnosis not present

## 2022-08-11 DIAGNOSIS — L821 Other seborrheic keratosis: Secondary | ICD-10-CM | POA: Diagnosis not present

## 2022-08-11 DIAGNOSIS — L814 Other melanin hyperpigmentation: Secondary | ICD-10-CM | POA: Diagnosis not present

## 2022-08-11 DIAGNOSIS — D2272 Melanocytic nevi of left lower limb, including hip: Secondary | ICD-10-CM | POA: Diagnosis not present

## 2022-08-11 DIAGNOSIS — B078 Other viral warts: Secondary | ICD-10-CM | POA: Diagnosis not present

## 2022-08-11 DIAGNOSIS — L578 Other skin changes due to chronic exposure to nonionizing radiation: Secondary | ICD-10-CM | POA: Diagnosis not present

## 2022-08-12 DIAGNOSIS — D128 Benign neoplasm of rectum: Secondary | ICD-10-CM | POA: Diagnosis not present

## 2022-08-12 DIAGNOSIS — D123 Benign neoplasm of transverse colon: Secondary | ICD-10-CM | POA: Diagnosis not present

## 2022-08-27 ENCOUNTER — Ambulatory Visit: Payer: Medicare Other

## 2022-09-16 DIAGNOSIS — R87811 Vaginal high risk human papillomavirus (HPV) DNA test positive: Secondary | ICD-10-CM | POA: Diagnosis not present

## 2022-09-16 DIAGNOSIS — Z1272 Encounter for screening for malignant neoplasm of vagina: Secondary | ICD-10-CM | POA: Diagnosis not present

## 2022-09-16 DIAGNOSIS — Z01419 Encounter for gynecological examination (general) (routine) without abnormal findings: Secondary | ICD-10-CM | POA: Diagnosis not present

## 2022-09-16 DIAGNOSIS — R87622 Low grade squamous intraepithelial lesion on cytologic smear of vagina (LGSIL): Secondary | ICD-10-CM | POA: Diagnosis not present

## 2022-10-02 ENCOUNTER — Ambulatory Visit
Admission: RE | Admit: 2022-10-02 | Discharge: 2022-10-02 | Disposition: A | Discharge status: Home / Self Care | Payer: Medicare Other | Source: Ambulatory Visit | Attending: Obstetrics and Gynecology | Admitting: Obstetrics and Gynecology

## 2022-10-02 DIAGNOSIS — Z1231 Encounter for screening mammogram for malignant neoplasm of breast: Secondary | ICD-10-CM | POA: Diagnosis not present

## 2022-10-14 DIAGNOSIS — R87622 Low grade squamous intraepithelial lesion on cytologic smear of vagina (LGSIL): Secondary | ICD-10-CM | POA: Diagnosis not present

## 2022-11-12 DIAGNOSIS — B09 Unspecified viral infection characterized by skin and mucous membrane lesions: Secondary | ICD-10-CM | POA: Diagnosis not present

## 2022-11-13 DIAGNOSIS — Z23 Encounter for immunization: Secondary | ICD-10-CM | POA: Diagnosis not present

## 2022-11-17 ENCOUNTER — Ambulatory Visit (HOSPITAL_COMMUNITY)
Admission: EM | Admit: 2022-11-17 | Discharge: 2022-11-17 | Disposition: A | Discharge status: Home / Self Care | Payer: Medicare Other

## 2022-11-17 ENCOUNTER — Encounter (HOSPITAL_COMMUNITY): Payer: Self-pay | Admitting: *Deleted

## 2022-11-17 DIAGNOSIS — S51851A Open bite of right forearm, initial encounter: Secondary | ICD-10-CM | POA: Diagnosis not present

## 2022-11-17 DIAGNOSIS — W540XXA Bitten by dog, initial encounter: Secondary | ICD-10-CM | POA: Diagnosis not present

## 2022-11-17 DIAGNOSIS — Z23 Encounter for immunization: Secondary | ICD-10-CM

## 2022-11-17 MED ORDER — CLINDAMYCIN HCL 300 MG PO CAPS: 300.0000 mg | ORAL_CAPSULE | Freq: Three times a day (TID) | ORAL | 0 refills | Status: DC

## 2022-11-17 MED ORDER — AMOXICILLIN-POT CLAVULANATE 875-125 MG PO TABS: 1.0000 | ORAL_TABLET | Freq: Two times a day (BID) | ORAL | 0 refills | Status: AC

## 2022-11-17 MED ORDER — TETANUS-DIPHTH-ACELL PERTUSSIS 5-2.5-18.5 LF-MCG/0.5 IM SUSY
0.5000 mL | PREFILLED_SYRINGE | Freq: Once | INTRAMUSCULAR | Status: AC
  Administered 2022-11-17: 0.5 mL via INTRAMUSCULAR

## 2022-11-17 MED ORDER — TETANUS-DIPHTH-ACELL PERTUSSIS 5-2.5-18.5 LF-MCG/0.5 IM SUSY
PREFILLED_SYRINGE | INTRAMUSCULAR | Status: AC
  Filled 2022-11-17: qty 0.5

## 2022-11-17 MED ORDER — DOXYCYCLINE HYCLATE 100 MG PO CAPS: 100.0000 mg | ORAL_CAPSULE | Freq: Two times a day (BID) | ORAL | 0 refills | Status: AC

## 2022-11-17 NOTE — ED Triage Notes (Signed)
Pt reports having a dog "clamp down" onto right forearm approx 15 min ago. Pt states dog belongs to a friend and is fully vaccinated. Pt tearful. No active bleeding.

## 2022-11-17 NOTE — ED Provider Notes (Addendum)
[facility]    CSN: [id number] Arrival date & time: 11/17/22  1512      History   Chief Complaint Chief Complaint  Patient presents with   Animal Bite    HPI Emily White is a 72 y.o. female.   Patient presents urgent care for evaluation of dog bite to the right forearm that happened at approximately 3:15 PM today prior to arrival urgent care.  She was walking into the dog's home to return some keys when the dog suddenly "clamped down" on her right forearm leaving 2 puncture wounds.  Wounds bled slightly after injury, bleeding is controlled at this time.  She is tearful and states the forearm hurts significantly.  The dog is vaccinated against rabies.  Patient denies recent antibiotic/steroid use.  Unknown last date of tetanus injection.     Past Medical History:  Diagnosis Date   Anxiety    Arthritis    Asthma    Corneal laceration of right eye    GERD (gastroesophageal reflux disease)    asymptomatic   Headache(784.0)    migraine   IBS (irritable bowel syndrome)    Migraine    Nausea and vomiting    After surgery   Weight loss     Patient Active Problem List   Diagnosis Date Noted   Acute pulmonary embolism ([place]) 10/09/2021   GAD (generalized anxiety disorder) 10/09/2021   Asthma    Atypical chest pain 10/08/2021   Migraine with aura and without status migrainosus, not intractable 08/26/2015   Vestibular migraine 08/26/2015    Past Surgical History:  Procedure Laterality Date   ABDOMINAL HYSTERECTOMY  2000   complete   APPENDECTOMY     COLONOSCOPY WITH PROPOFOL N/A 06/28/2014   Procedure: COLONOSCOPY WITH PROPOFOL;  Surgeon: Cleotis Nipper, MD;  Location: [facility];  Service: Endoscopy;  Laterality: N/A;   OVARIAN CYST REMOVAL Right 1983    OB History   No obstetric history on file.      Home Medications    Prior to Admission medications   Medication Sig Start Date End Date Taking? Authorizing Provider  ALPRAZolam (XANAX)  0.25 MG tablet Take 0.125 mg by mouth at bedtime as needed for anxiety or sleep.   Yes [provider]  amoxicillin-clavulanate (AUGMENTIN) 875-125 MG tablet Take 1 tablet by mouth every 12 (twelve) hours. 11/17/22  Yes Talbot Grumbling, FNP  BIOTIN PO Take 1 capsule by mouth daily.   Yes [provider]  Cholecalciferol (VITAMIN D) 2000 UNITS tablet Take 2,000 Units by mouth daily.    Yes [provider]  Digestive Enzymes (DIGESTIVE ENZYME PO) Take 86 mg by mouth daily.   Yes [provider]  doxycycline (VIBRAMYCIN) 100 MG capsule Take 1 capsule (100 mg total) by mouth 2 (two) times daily for 7 days. 11/17/22 11/24/22 Yes StanhopeStasia Cavalier, FNP  levalbuterol [facility] HFA) 45 MCG/ACT inhaler Inhale into the lungs every 4 (four) hours as needed for wheezing.   Yes [provider]  Magnesium Glycinate 665 MG CAPS Take 665 mg by mouth at bedtime.   Yes [provider]  Melatonin 1 MG TABS Take 1 tablet by mouth at bedtime as needed (for sleep).   Yes [provider]  OVER THE COUNTER MEDICATION Take 1 tablet by mouth daily. Astragalus-for immune function   Yes [provider]  OVER THE COUNTER MEDICATION Take 1 tablet by mouth daily. Black current seed   Yes [provider]  OVER THE COUNTER MEDICATION Ashwaganda- immune system supplement   Yes [provider]  Probiotic Product (PROBIOTIC DAILY PO) Take 1 capsule by mouth daily.   Yes [provider]  venlafaxine XR (EFFEXOR-XR) 37.5 MG 24 hr capsule Take 1 capsule (37.5 mg total) by mouth daily with breakfast. 06/15/18  Yes Melvenia Beam, MD  vitamin B-12 (CYANOCOBALAMIN) 1000 MCG tablet Take 1,000 mcg by mouth daily.   Yes [provider]  vitamin C (ASCORBIC ACID) 500 MG tablet Take 500 mg by mouth daily.   Yes [provider]  acetaminophen (TYLENOL) 500 MG tablet Take 1,000 mg by mouth every 6 (six) hours as needed for  moderate pain or headache.    [provider]  albuterol (PROVENTIL HFA;VENTOLIN HFA) 108 (90 BASE) MCG/ACT inhaler Inhale 1 puff into the lungs every 6 (six) hours as needed for wheezing or shortness of breath.    [provider]  apixaban (ELIQUIS) 5 MG TABS tablet Take 2 tablets (10 mg total) by mouth 2 (two) times daily for 13 doses. After completing this prescription, start the second prescription of Eliquis. 10/09/21 10/15/21  Hongalgi, Lenis Dickinson, MD  apixaban (ELIQUIS) 5 MG TABS tablet Take 1 tablet (5 mg total) by mouth 2 (two) times daily. This is to be started only after you finish the first prescription of Eliquis. 10/16/21   Hongalgi, Lenis Dickinson, MD  fluticasone (FLOVENT HFA) 44 MCG/ACT inhaler Inhale 2 puffs into the lungs every morning.    [provider]    Family History Family History  Problem Relation Age of Onset   Migraines Mother    Migraines Brother    Breast cancer Paternal Aunt    Thyroid cancer Daughter    Other Daughter        liver hemangioma    Social History Social History   Tobacco Use   Smoking status: Former    Packs/day: 2.00    Years: 5.00    Total pack years: 10.00    Types: Cigarettes    Quit date: 11/16/1968    Years since quitting: 54.0   Smokeless tobacco: Never  Vaping Use   Vaping Use: Never used  Substance Use Topics   Alcohol use: Yes    Comment: 1 glass wine per day   Drug use: No     Allergies   Keflex [cephalexin], Macrobid  [nitrofurantoin macrocrystal], Cucumber extract, Other, Sulfamethoxazole-trimethoprim, and Macrobid [nitrofurantoin monohyd macro]   Review of Systems Review of Systems Per HPI  Physical Exam Triage Vital Signs ED Triage Vitals  Enc Vitals Group     BP 11/17/22 1526 (!) 149/78     Pulse Rate 11/17/22 1528 86     Resp 11/17/22 1528 18     Temp 11/17/22 1526 98 F (36.7 C)     Temp Source 11/17/22 1526 Oral     SpO2 11/17/22 1528 100 %     Weight --      Height --      Head  Circumference --      Peak Flow --      Pain Score --      Pain Loc --      Pain Edu? --      Excl. in [place]? --    No data found.  Updated Vital Signs BP (!) 149/78   Pulse 86   Temp 98 F (36.7 C) (Oral)   Resp 18   SpO2 100%   Visual  Acuity Right Eye Distance:   Left Eye Distance:   Bilateral Distance:    Right Eye Near:   Left Eye Near:    Bilateral Near:     Physical Exam Vitals and nursing note reviewed.  Constitutional:      Appearance: She is not ill-appearing or toxic-appearing.  HENT:     Head: Normocephalic and atraumatic.     Right Ear: Hearing and external ear normal.     Left Ear: Hearing and external ear normal.     Nose: Nose normal.     Mouth/Throat:     Lips: Pink.  Eyes:     General: Lids are normal. Vision grossly intact. Gaze aligned appropriately.     Extraocular Movements: Extraocular movements intact.     Conjunctiva/sclera: Conjunctivae normal.  Cardiovascular:     Rate and Rhythm: Normal rate and regular rhythm.     Heart sounds: Normal heart sounds, S1 normal and S2 normal.  Pulmonary:     Effort: Pulmonary effort is normal. No respiratory distress.     Breath sounds: Normal breath sounds and air entry.  Musculoskeletal:     Cervical back: Neck supple.  Skin:    General: Skin is warm and dry.     Capillary Refill: Capillary refill takes less than 2 seconds.     Findings: Wound present. No rash.     Comments: 2 puncture wounds to the right anterior forearm as seen in images below.  Normal range of motion of the right upper extremity at the elbow joint and the wrist joint.  5/5 grip strength to the bilateral upper extremities.  Capillary refill is less than 3.  She is neurovascularly intact distal to injuries.  Tender to palpation to the surrounding wound bed.  No active bleeding at this time.  Neurological:     General: No focal deficit present.     Mental Status: She is alert and oriented to person, place, and time. Mental status is at  baseline.     Cranial Nerves: No dysarthria or facial asymmetry.  Psychiatric:        Mood and Affect: Mood normal.        Speech: Speech normal.        Behavior: Behavior normal.        Thought Content: Thought content normal.        Judgment: Judgment normal.      UC Treatments / Results  Labs (all labs ordered are listed, but only abnormal results are displayed) Labs Reviewed - No data to display  EKG   Radiology No results found.  Procedures Procedures (including critical care time)  Medications Ordered in UC Medications  Tdap (BOOSTRIX) injection 0.5 mL (0.5 mLs Intramuscular Given 11/17/22 1540)    Initial Impression / Assessment and Plan / UC Course  I have reviewed the triage vital signs and the nursing notes.  Pertinent labs & imaging results that were available during my care of the patient were reviewed by me and considered in my medical decision making (see chart for details).   1.  Dog bite of right forearm No indication for rabies vaccination/Kedrab administration.  Wound cleansed and dressed in clinic with nonstick gauze, gauze wrap, and Coban wrap.  Mupirocin ointment applied to wounds topically in clinic.  Patient's chart states she is allergic to Keflex antibiotic with anaphylaxis and nausea/vomiting.  Patient states she has never had a throat closing sensation, shortness of breath, or chest discomfort/anaphylaxis when taking Keflex but did  have nausea and vomiting as well as abdominal pain.  She is confident that she has taken Augmentin in the past without allergic reaction.  Recommended use of clindamycin and doxycycline instead of Augmentin, she states doxycycline causes significant GI upset.  She cannot take Bactrim due to significant GI upset as well due to functional IBS.  Discussed risk associated with using Augmentin, she is agreeable to taking Augmentin twice daily for the next 7 days to treat prophylactically for infection to dog bite wound.  Strict  urgent care return precautions discussed should she experience any side effects or allergic reaction to the Augmentin antibiotic.  She is to change the dressing twice daily and monitor the site for signs of infection.  She is neurovascularly intact with stable musculoskeletal exam, therefore we deferred imaging.  PCP follow-up recommended in the next 1 to 2 weeks to ensure wounds are healing well.  Tetanus injection updated today.  Discussed physical exam and available lab work findings in clinic with patient.  Counseled patient regarding appropriate use of medications and potential side effects for all medications recommended or prescribed today. Discussed red flag signs and symptoms of worsening condition,when to call the PCP office, return to urgent care, and when to seek higher level of care in the emergency department. Patient verbalizes understanding and agreement with plan. All questions answered. Patient discharged in stable condition.   Final Clinical Impressions(s) / UC Diagnoses   Final diagnoses:  Dog bite of right forearm, initial encounter     Discharge Instructions      Take Augmentin antibiotic twice daily for the next 7 days. Take antibiotics with food to avoid stomach upset. May purchase a probiotic and take this daily to help prevent diarrhea related to antibiotic administration.  Keep the wound covered and dry.  Change the dressing to the wound twice daily with nonstick gauze and Coban wrap.  If you notice any worsening signs of infection coming from the wound such as redness, swelling, pus, pain, or fever/chills, I would like for you to return to urgent care for reevaluation.  We updated your tetanus injection today.  If you develop any new or worsening symptoms or do not improve in the next 2 to 3 days, please return.  If your symptoms are severe, please go to the emergency room.  Follow-up with your primary care provider for further evaluation and management of  your symptoms as well as ongoing wellness visits.  I hope you feel better!     ED Prescriptions     Medication Sig Dispense Auth. Provider   clindamycin (CLEOCIN) 300 MG capsule  (Status: Discontinued) Take 1 capsule (300 mg total) by mouth 3 (three) times daily for 7 days. 21 capsule Joella Prince M, FNP   doxycycline (VIBRAMYCIN) 100 MG capsule Take 1 capsule (100 mg total) by mouth 2 (two) times daily for 7 days. 14 capsule Talbot Grumbling, FNP   amoxicillin-clavulanate (AUGMENTIN) 875-125 MG tablet Take 1 tablet by mouth every 12 (twelve) hours. 14 tablet Talbot Grumbling, FNP      PDMP not reviewed this encounter.   Talbot Grumbling, [place] 11/17/22 1603    Talbot Grumbling, FNP 11/17/22 [phone]

## 2022-11-17 NOTE — Discharge Instructions (Addendum)
Take Augmentin antibiotic twice daily for the next 7 days. Take antibiotics with food to avoid stomach upset. May purchase a probiotic and take this daily to help prevent diarrhea related to antibiotic administration.  Keep the wound covered and dry.  Change the dressing to the wound twice daily with nonstick gauze and Coban wrap.  If you notice any worsening signs of infection coming from the wound such as redness, swelling, pus, pain, or fever/chills, I would like for you to return to urgent care for reevaluation.  We updated your tetanus injection today.  If you develop any new or worsening symptoms or do not improve in the next 2 to 3 days, please return.  If your symptoms are severe, please go to the emergency room.  Follow-up with your primary care provider for further evaluation and management of your symptoms as well as ongoing wellness visits.  I hope you feel better!

## 2023-01-06 ENCOUNTER — Ambulatory Visit
Admission: RE | Admit: 2023-01-06 | Discharge: 2023-01-06 | Disposition: A | Discharge status: Home / Self Care | Payer: Medicare Other | Source: Ambulatory Visit | Attending: Family Medicine | Admitting: Family Medicine

## 2023-01-06 DIAGNOSIS — Z780 Asymptomatic menopausal state: Secondary | ICD-10-CM | POA: Diagnosis not present

## 2023-01-06 DIAGNOSIS — M85851 Other specified disorders of bone density and structure, right thigh: Secondary | ICD-10-CM | POA: Diagnosis not present

## 2023-01-06 DIAGNOSIS — E2839 Other primary ovarian failure: Secondary | ICD-10-CM

## 2023-01-19 IMAGING — DX DG CERVICAL SPINE COMPLETE 4+V
5 series · 5 of 5 positions shown · non-contrast
Comparison: None.

CLINICAL DATA: Posterior neck pain, radicular right arm pain

EXAM:
CERVICAL SPINE - COMPLETE 4+ VIEW

[dg cervical spine complete (1 of 5)]
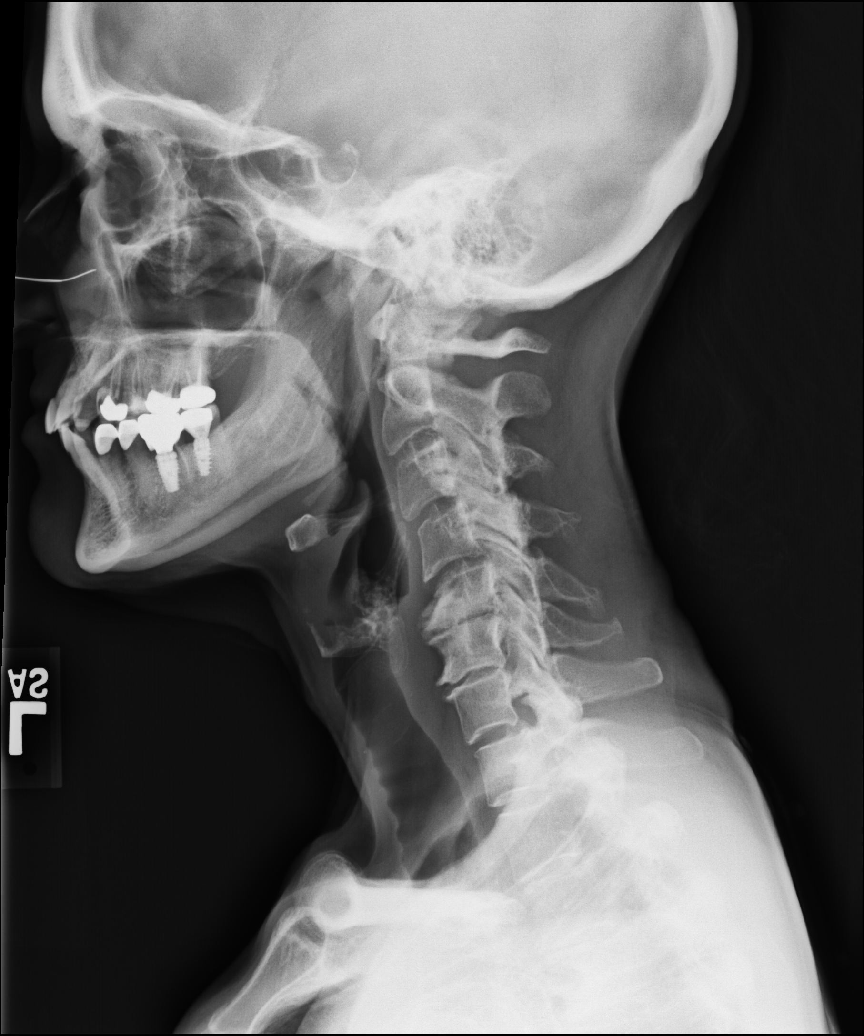

[dg cervical spine complete (2 of 5)]
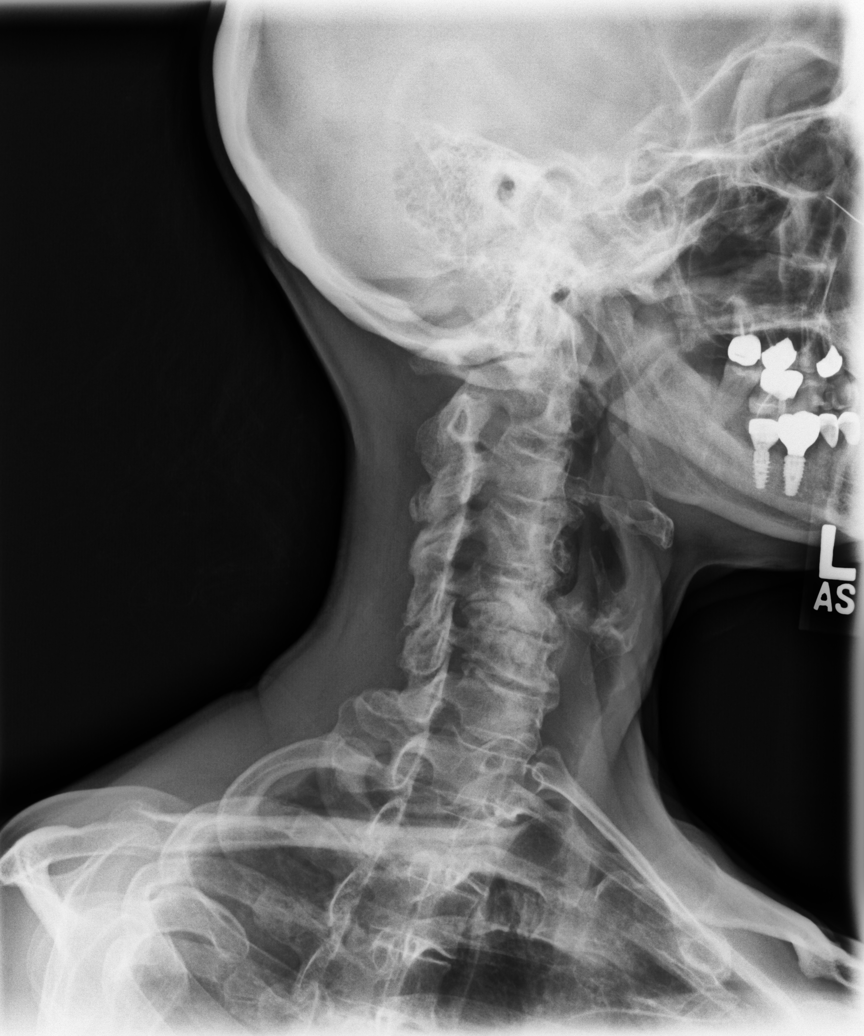

[dg cervical spine complete (3 of 5)]
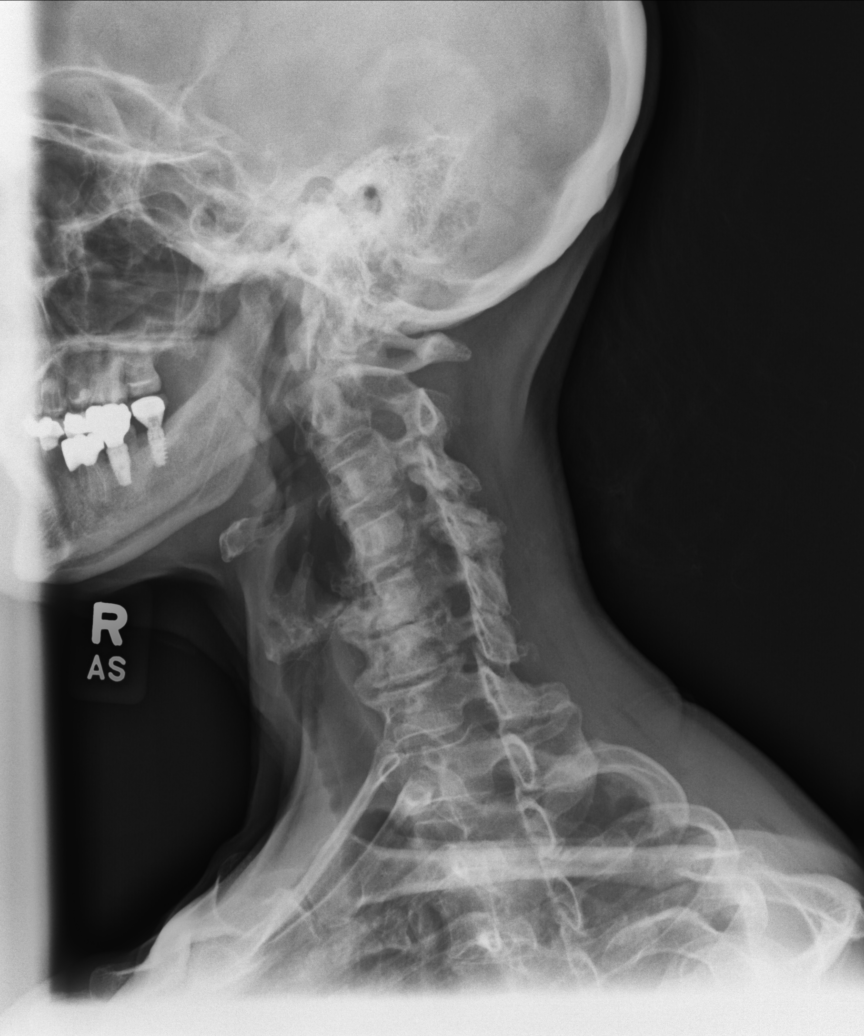

[dg cervical spine complete (4 of 5)]
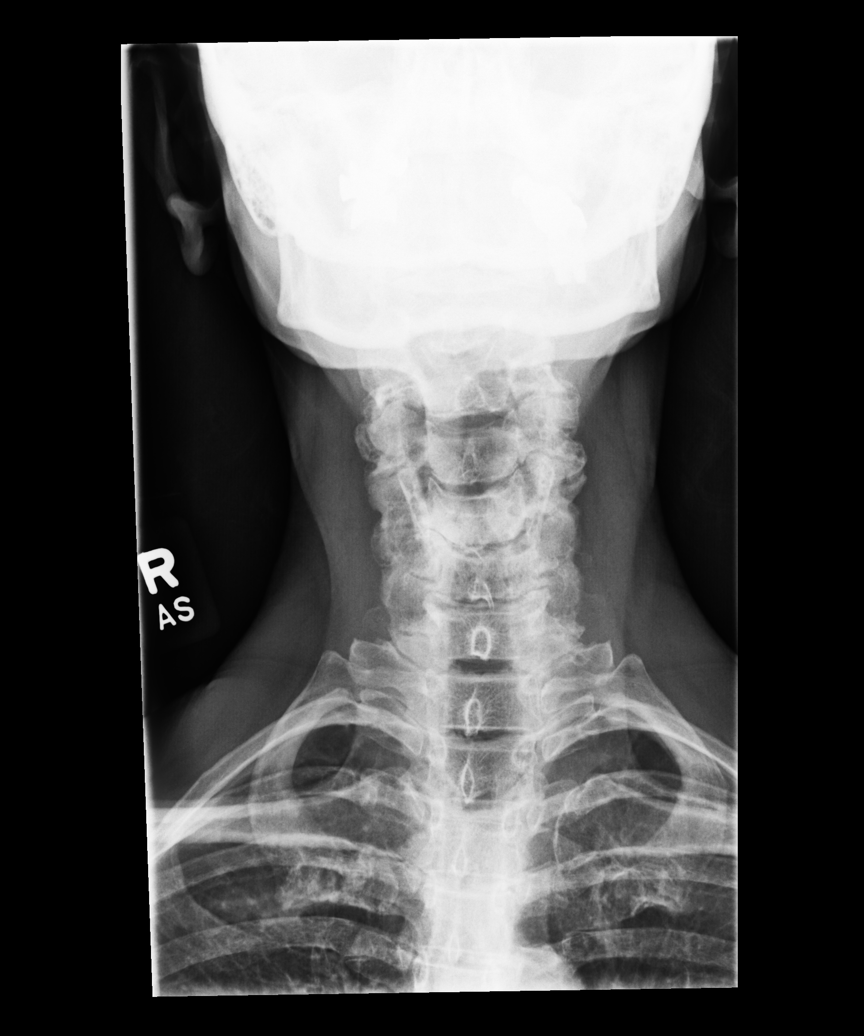

[dg cervical spine complete (5 of 5)]
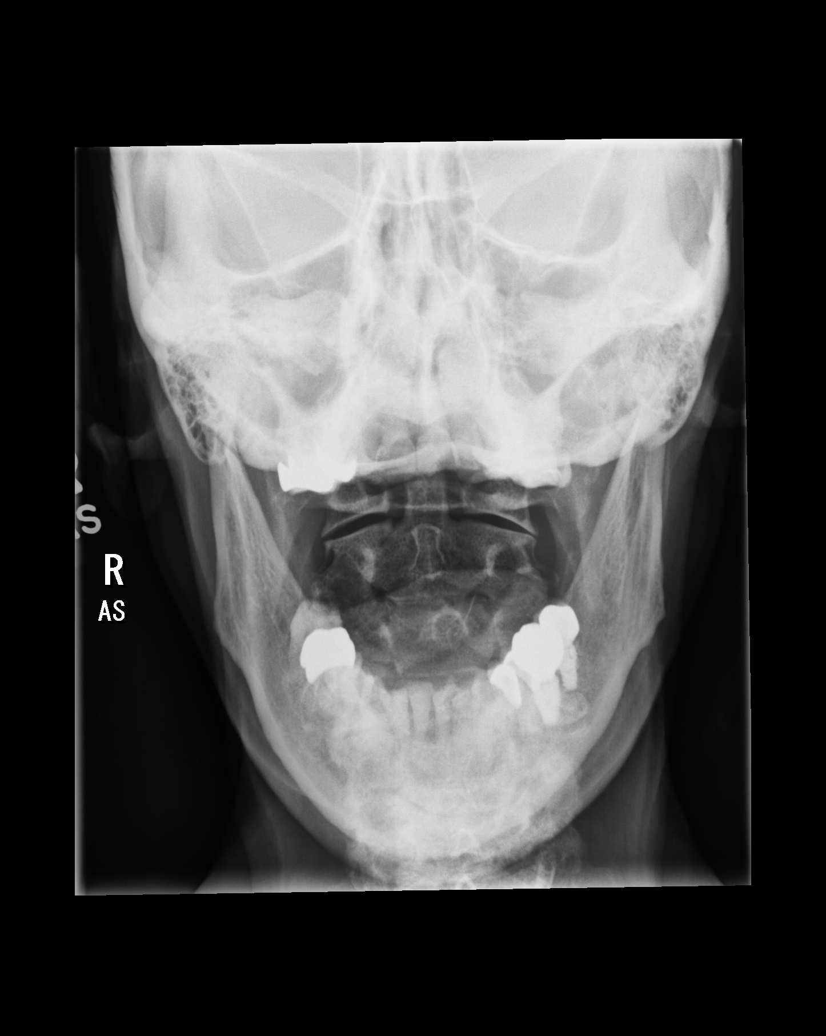

[5 of 5 positions shown; findings below may reference images not displayed]

FINDINGS: There is straightening of the cervical spine. No acute fracture or
listhesis. Vertebral body height has been preserved. There is
intervertebral disc space narrowing and endplate remodeling at C5-6
and C6-7 in keeping with changes of moderate to severe degenerative
disc disease. Mild degenerative disc disease noted at at C4-5. Mild
central canal stenosis at C5 secondary to posterior disc osteophyte
complex at C4-5. Oblique views demonstrate moderate right
neuroforaminal narrowing at C5-6 and C6-7 secondary to uncovertebral
arthrosis. Mild neuroforaminal narrowing at C3-4 secondary to facet
arthrosis. On the left, there is mild neuroforaminal narrowing at
C3-4 and C4-5 secondary to facet arthrosis.
IMPRESSION: Multilevel degenerative disc and degenerative joint disease
resulting in moderate right neuroforaminal narrowing at C5-6 and
C6-7.

Mild central canal stenosis at C4-5.

## 2023-03-05 DIAGNOSIS — R519 Headache, unspecified: Secondary | ICD-10-CM | POA: Diagnosis not present

## 2023-03-05 DIAGNOSIS — J029 Acute pharyngitis, unspecified: Secondary | ICD-10-CM | POA: Diagnosis not present

## 2023-03-05 DIAGNOSIS — R059 Cough, unspecified: Secondary | ICD-10-CM | POA: Diagnosis not present

## 2023-04-13 DIAGNOSIS — N760 Acute vaginitis: Secondary | ICD-10-CM | POA: Diagnosis not present

## 2023-04-13 DIAGNOSIS — R102 Pelvic and perineal pain: Secondary | ICD-10-CM | POA: Diagnosis not present

## 2023-04-13 DIAGNOSIS — R87622 Low grade squamous intraepithelial lesion on cytologic smear of vagina (LGSIL): Secondary | ICD-10-CM | POA: Diagnosis not present

## 2023-04-15 DIAGNOSIS — N760 Acute vaginitis: Secondary | ICD-10-CM | POA: Diagnosis not present

## 2023-04-23 DIAGNOSIS — H40013 Open angle with borderline findings, low risk, bilateral: Secondary | ICD-10-CM | POA: Diagnosis not present

## 2023-04-23 DIAGNOSIS — H2513 Age-related nuclear cataract, bilateral: Secondary | ICD-10-CM | POA: Diagnosis not present

## 2023-07-28 DIAGNOSIS — Z0000 Encounter for general adult medical examination without abnormal findings: Secondary | ICD-10-CM | POA: Diagnosis not present

## 2023-07-28 DIAGNOSIS — F411 Generalized anxiety disorder: Secondary | ICD-10-CM | POA: Diagnosis not present

## 2023-07-28 DIAGNOSIS — Z86711 Personal history of pulmonary embolism: Secondary | ICD-10-CM | POA: Diagnosis not present

## 2023-07-28 DIAGNOSIS — H40009 Preglaucoma, unspecified, unspecified eye: Secondary | ICD-10-CM | POA: Diagnosis not present

## 2023-07-28 DIAGNOSIS — J45909 Unspecified asthma, uncomplicated: Secondary | ICD-10-CM | POA: Diagnosis not present

## 2023-07-28 DIAGNOSIS — Z79899 Other long term (current) drug therapy: Secondary | ICD-10-CM | POA: Diagnosis not present

## 2023-07-28 DIAGNOSIS — K589 Irritable bowel syndrome without diarrhea: Secondary | ICD-10-CM | POA: Diagnosis not present

## 2023-07-28 DIAGNOSIS — G43909 Migraine, unspecified, not intractable, without status migrainosus: Secondary | ICD-10-CM | POA: Diagnosis not present

## 2023-08-24 DIAGNOSIS — D2272 Melanocytic nevi of left lower limb, including hip: Secondary | ICD-10-CM | POA: Diagnosis not present

## 2023-08-24 DIAGNOSIS — L814 Other melanin hyperpigmentation: Secondary | ICD-10-CM | POA: Diagnosis not present

## 2023-08-24 DIAGNOSIS — L578 Other skin changes due to chronic exposure to nonionizing radiation: Secondary | ICD-10-CM | POA: Diagnosis not present

## 2023-08-24 DIAGNOSIS — L821 Other seborrheic keratosis: Secondary | ICD-10-CM | POA: Diagnosis not present

## 2023-08-24 DIAGNOSIS — D225 Melanocytic nevi of trunk: Secondary | ICD-10-CM | POA: Diagnosis not present

## 2023-09-13 ENCOUNTER — Other Ambulatory Visit: Payer: Self-pay | Admitting: Obstetrics and Gynecology

## 2023-09-13 DIAGNOSIS — Z1231 Encounter for screening mammogram for malignant neoplasm of breast: Secondary | ICD-10-CM

## 2023-10-20 ENCOUNTER — Ambulatory Visit: Payer: Medicare Other

## 2023-10-26 ENCOUNTER — Ambulatory Visit: Payer: Medicare Other

## 2023-10-26 DIAGNOSIS — J45909 Unspecified asthma, uncomplicated: Secondary | ICD-10-CM | POA: Diagnosis not present

## 2023-10-26 DIAGNOSIS — J329 Chronic sinusitis, unspecified: Secondary | ICD-10-CM | POA: Diagnosis not present

## 2023-11-16 DIAGNOSIS — N390 Urinary tract infection, site not specified: Secondary | ICD-10-CM | POA: Diagnosis not present

## 2023-11-16 DIAGNOSIS — R300 Dysuria: Secondary | ICD-10-CM | POA: Diagnosis not present

## 2023-11-16 DIAGNOSIS — Z7251 High risk heterosexual behavior: Secondary | ICD-10-CM | POA: Diagnosis not present

## 2023-11-16 DIAGNOSIS — E2839 Other primary ovarian failure: Secondary | ICD-10-CM | POA: Diagnosis not present

## 2023-12-14 ENCOUNTER — Ambulatory Visit
Admission: RE | Admit: 2023-12-14 | Discharge: 2023-12-14 | Disposition: A | Discharge status: Home / Self Care | Payer: Medicare Other | Source: Ambulatory Visit | Attending: Obstetrics and Gynecology | Admitting: Obstetrics and Gynecology

## 2023-12-14 DIAGNOSIS — Z1231 Encounter for screening mammogram for malignant neoplasm of breast: Secondary | ICD-10-CM | POA: Diagnosis not present

## 2023-12-17 DIAGNOSIS — N390 Urinary tract infection, site not specified: Secondary | ICD-10-CM | POA: Diagnosis not present

## 2023-12-17 DIAGNOSIS — Z86711 Personal history of pulmonary embolism: Secondary | ICD-10-CM | POA: Diagnosis not present

## 2024-02-14 DIAGNOSIS — F411 Generalized anxiety disorder: Secondary | ICD-10-CM | POA: Diagnosis not present

## 2024-02-14 DIAGNOSIS — J45909 Unspecified asthma, uncomplicated: Secondary | ICD-10-CM | POA: Diagnosis not present

## 2024-03-03 DIAGNOSIS — N390 Urinary tract infection, site not specified: Secondary | ICD-10-CM | POA: Diagnosis not present

## 2024-03-03 DIAGNOSIS — R399 Unspecified symptoms and signs involving the genitourinary system: Secondary | ICD-10-CM | POA: Diagnosis not present

## 2024-03-07 ENCOUNTER — Other Ambulatory Visit: Payer: Self-pay | Admitting: Family Medicine

## 2024-03-07 ENCOUNTER — Ambulatory Visit
Admission: RE | Admit: 2024-03-07 | Discharge: 2024-03-07 | Disposition: A | Discharge status: Home / Self Care | Source: Ambulatory Visit | Attending: Family Medicine | Admitting: Family Medicine

## 2024-03-07 DIAGNOSIS — M542 Cervicalgia: Secondary | ICD-10-CM

## 2024-03-07 DIAGNOSIS — M2550 Pain in unspecified joint: Secondary | ICD-10-CM | POA: Diagnosis not present

## 2024-03-07 DIAGNOSIS — M5412 Radiculopathy, cervical region: Secondary | ICD-10-CM | POA: Diagnosis not present

## 2024-03-07 DIAGNOSIS — M549 Dorsalgia, unspecified: Secondary | ICD-10-CM

## 2024-03-07 DIAGNOSIS — M546 Pain in thoracic spine: Secondary | ICD-10-CM | POA: Diagnosis not present

## 2024-03-07 DIAGNOSIS — R109 Unspecified abdominal pain: Secondary | ICD-10-CM

## 2024-03-07 DIAGNOSIS — R1032 Left lower quadrant pain: Secondary | ICD-10-CM | POA: Diagnosis not present

## 2024-03-08 ENCOUNTER — Ambulatory Visit
Admission: RE | Admit: 2024-03-08 | Discharge: 2024-03-08 | Disposition: A | Discharge status: Home / Self Care | Source: Ambulatory Visit | Attending: Family Medicine | Admitting: Family Medicine

## 2024-03-08 DIAGNOSIS — R109 Unspecified abdominal pain: Secondary | ICD-10-CM

## 2024-03-08 DIAGNOSIS — Z90710 Acquired absence of both cervix and uterus: Secondary | ICD-10-CM | POA: Diagnosis not present

## 2024-03-08 DIAGNOSIS — K7689 Other specified diseases of liver: Secondary | ICD-10-CM | POA: Diagnosis not present

## 2024-03-08 MED ORDER — IOPAMIDOL (ISOVUE-300) INJECTION 61%
500.0000 mL | Freq: Once | INTRAVENOUS | Status: AC | PRN
  Administered 2024-03-08: 80 mL via INTRAVENOUS

## 2024-03-15 DIAGNOSIS — F411 Generalized anxiety disorder: Secondary | ICD-10-CM | POA: Diagnosis not present

## 2024-03-15 DIAGNOSIS — J45909 Unspecified asthma, uncomplicated: Secondary | ICD-10-CM | POA: Diagnosis not present

## 2024-03-22 ENCOUNTER — Other Ambulatory Visit: Payer: Self-pay | Admitting: Family Medicine

## 2024-03-22 DIAGNOSIS — M47812 Spondylosis without myelopathy or radiculopathy, cervical region: Secondary | ICD-10-CM

## 2024-04-02 ENCOUNTER — Ambulatory Visit
Admission: RE | Admit: 2024-04-02 | Discharge: 2024-04-02 | Disposition: A | Discharge status: Home / Self Care | Source: Ambulatory Visit | Attending: Family Medicine | Admitting: Family Medicine

## 2024-04-02 DIAGNOSIS — M47812 Spondylosis without myelopathy or radiculopathy, cervical region: Secondary | ICD-10-CM | POA: Diagnosis not present

## 2024-04-02 DIAGNOSIS — M4802 Spinal stenosis, cervical region: Secondary | ICD-10-CM | POA: Diagnosis not present

## 2024-04-15 DIAGNOSIS — F411 Generalized anxiety disorder: Secondary | ICD-10-CM | POA: Diagnosis not present

## 2024-04-15 DIAGNOSIS — J45909 Unspecified asthma, uncomplicated: Secondary | ICD-10-CM | POA: Diagnosis not present

## 2024-05-08 DIAGNOSIS — M2559 Pain in other specified joint: Secondary | ICD-10-CM | POA: Diagnosis not present

## 2024-05-08 DIAGNOSIS — Z681 Body mass index (BMI) 19 or less, adult: Secondary | ICD-10-CM | POA: Diagnosis not present

## 2024-05-08 DIAGNOSIS — M353 Polymyalgia rheumatica: Secondary | ICD-10-CM | POA: Diagnosis not present

## 2024-05-08 DIAGNOSIS — M5030 Other cervical disc degeneration, unspecified cervical region: Secondary | ICD-10-CM | POA: Diagnosis not present

## 2024-05-31 DIAGNOSIS — M4712 Other spondylosis with myelopathy, cervical region: Secondary | ICD-10-CM | POA: Diagnosis not present

## 2024-05-31 DIAGNOSIS — M542 Cervicalgia: Secondary | ICD-10-CM | POA: Diagnosis not present

## 2024-06-01 DIAGNOSIS — M353 Polymyalgia rheumatica: Secondary | ICD-10-CM | POA: Diagnosis not present

## 2024-06-01 DIAGNOSIS — Z681 Body mass index (BMI) 19 or less, adult: Secondary | ICD-10-CM | POA: Diagnosis not present

## 2024-06-01 DIAGNOSIS — R768 Other specified abnormal immunological findings in serum: Secondary | ICD-10-CM | POA: Diagnosis not present

## 2024-06-01 DIAGNOSIS — M5030 Other cervical disc degeneration, unspecified cervical region: Secondary | ICD-10-CM | POA: Diagnosis not present

## 2024-06-22 DIAGNOSIS — M4802 Spinal stenosis, cervical region: Secondary | ICD-10-CM | POA: Diagnosis not present

## 2024-07-16 DIAGNOSIS — J45909 Unspecified asthma, uncomplicated: Secondary | ICD-10-CM | POA: Diagnosis not present

## 2024-07-16 DIAGNOSIS — F411 Generalized anxiety disorder: Secondary | ICD-10-CM | POA: Diagnosis not present

## 2024-08-02 DIAGNOSIS — K589 Irritable bowel syndrome without diarrhea: Secondary | ICD-10-CM | POA: Diagnosis not present

## 2024-08-02 DIAGNOSIS — Z136 Encounter for screening for cardiovascular disorders: Secondary | ICD-10-CM | POA: Diagnosis not present

## 2024-08-02 DIAGNOSIS — Z13220 Encounter for screening for lipoid disorders: Secondary | ICD-10-CM | POA: Diagnosis not present

## 2024-08-02 DIAGNOSIS — Z0000 Encounter for general adult medical examination without abnormal findings: Secondary | ICD-10-CM | POA: Diagnosis not present

## 2024-08-02 DIAGNOSIS — Z131 Encounter for screening for diabetes mellitus: Secondary | ICD-10-CM | POA: Diagnosis not present

## 2024-08-02 DIAGNOSIS — G43909 Migraine, unspecified, not intractable, without status migrainosus: Secondary | ICD-10-CM | POA: Diagnosis not present

## 2024-08-02 DIAGNOSIS — N390 Urinary tract infection, site not specified: Secondary | ICD-10-CM | POA: Diagnosis not present

## 2024-08-02 DIAGNOSIS — Z86711 Personal history of pulmonary embolism: Secondary | ICD-10-CM | POA: Diagnosis not present

## 2024-08-02 DIAGNOSIS — Z79899 Other long term (current) drug therapy: Secondary | ICD-10-CM | POA: Diagnosis not present

## 2024-08-02 DIAGNOSIS — F411 Generalized anxiety disorder: Secondary | ICD-10-CM | POA: Diagnosis not present

## 2024-08-02 DIAGNOSIS — Z1331 Encounter for screening for depression: Secondary | ICD-10-CM | POA: Diagnosis not present

## 2024-08-02 DIAGNOSIS — J45909 Unspecified asthma, uncomplicated: Secondary | ICD-10-CM | POA: Diagnosis not present

## 2024-08-02 DIAGNOSIS — H40009 Preglaucoma, unspecified, unspecified eye: Secondary | ICD-10-CM | POA: Diagnosis not present

## 2024-08-02 DIAGNOSIS — M353 Polymyalgia rheumatica: Secondary | ICD-10-CM | POA: Diagnosis not present

## 2024-08-30 DIAGNOSIS — M5030 Other cervical disc degeneration, unspecified cervical region: Secondary | ICD-10-CM | POA: Diagnosis not present

## 2024-08-30 DIAGNOSIS — R7681 Abnormal rheumatoid factor and anti-citrullinated protein antibody without rheumatoid arthritis: Secondary | ICD-10-CM | POA: Diagnosis not present

## 2024-08-30 DIAGNOSIS — M7989 Other specified soft tissue disorders: Secondary | ICD-10-CM | POA: Diagnosis not present

## 2024-08-30 DIAGNOSIS — Z681 Body mass index (BMI) 19 or less, adult: Secondary | ICD-10-CM | POA: Diagnosis not present

## 2024-08-30 DIAGNOSIS — M353 Polymyalgia rheumatica: Secondary | ICD-10-CM | POA: Diagnosis not present

## 2024-09-13 DIAGNOSIS — L814 Other melanin hyperpigmentation: Secondary | ICD-10-CM | POA: Diagnosis not present

## 2024-09-13 DIAGNOSIS — D225 Melanocytic nevi of trunk: Secondary | ICD-10-CM | POA: Diagnosis not present

## 2024-09-13 DIAGNOSIS — L578 Other skin changes due to chronic exposure to nonionizing radiation: Secondary | ICD-10-CM | POA: Diagnosis not present

## 2024-09-13 DIAGNOSIS — D235 Other benign neoplasm of skin of trunk: Secondary | ICD-10-CM | POA: Diagnosis not present

## 2024-09-13 DIAGNOSIS — L821 Other seborrheic keratosis: Secondary | ICD-10-CM | POA: Diagnosis not present

## 2024-09-13 DIAGNOSIS — D2272 Melanocytic nevi of left lower limb, including hip: Secondary | ICD-10-CM | POA: Diagnosis not present

## 2024-09-21 DIAGNOSIS — Z01419 Encounter for gynecological examination (general) (routine) without abnormal findings: Secondary | ICD-10-CM | POA: Diagnosis not present

## 2024-09-21 DIAGNOSIS — Z1272 Encounter for screening for malignant neoplasm of vagina: Secondary | ICD-10-CM | POA: Diagnosis not present

## 2024-09-21 DIAGNOSIS — R87620 Atypical squamous cells of undetermined significance on cytologic smear of vagina (ASC-US): Secondary | ICD-10-CM | POA: Diagnosis not present

## 2024-10-04 DIAGNOSIS — J019 Acute sinusitis, unspecified: Secondary | ICD-10-CM | POA: Diagnosis not present

## 2024-10-04 DIAGNOSIS — D84821 Immunodeficiency due to drugs: Secondary | ICD-10-CM | POA: Diagnosis not present

## 2024-10-04 DIAGNOSIS — M353 Polymyalgia rheumatica: Secondary | ICD-10-CM | POA: Diagnosis not present

## 2024-12-12 ENCOUNTER — Other Ambulatory Visit: Payer: Self-pay | Admitting: Family Medicine

## 2024-12-12 DIAGNOSIS — Z1231 Encounter for screening mammogram for malignant neoplasm of breast: Secondary | ICD-10-CM

## 2024-12-20 ENCOUNTER — Ambulatory Visit
Admission: RE | Admit: 2024-12-20 | Discharge: 2024-12-20 | Disposition: A | Source: Ambulatory Visit | Attending: Family Medicine | Admitting: Family Medicine

## 2024-12-20 DIAGNOSIS — Z1231 Encounter for screening mammogram for malignant neoplasm of breast: Secondary | ICD-10-CM
